# Patient Record
Sex: Female | Born: 1990 | Marital: Single | Smoking: Never smoker
Health system: Southern US, Community
[De-identification: ages and names within clinical notes are randomized; demographics above are authoritative.]

## PROBLEM LIST (undated history)

## (undated) DIAGNOSIS — Z789 Other specified health status: Secondary | ICD-10-CM

## (undated) HISTORY — PX: NO PAST SURGERIES: SHX2092

---

## 2017-02-01 ENCOUNTER — Encounter: Payer: Self-pay | Admitting: Certified Nurse Midwife

## 2017-02-06 ENCOUNTER — Other Ambulatory Visit (HOSPITAL_COMMUNITY)
Admission: RE | Admit: 2017-02-06 | Discharge: 2017-02-06 | Disposition: A | Discharge status: Home / Self Care | Payer: Medicaid Other | Source: Ambulatory Visit | Attending: Certified Nurse Midwife | Admitting: Certified Nurse Midwife

## 2017-02-06 ENCOUNTER — Ambulatory Visit (INDEPENDENT_AMBULATORY_CARE_PROVIDER_SITE_OTHER): Payer: Medicaid Other | Admitting: Obstetrics

## 2017-02-06 ENCOUNTER — Encounter: Payer: Self-pay | Admitting: Obstetrics

## 2017-02-06 VITALS — BP 122/73 | HR 76 | Ht 62.0 in | Wt 136.8 lb

## 2017-02-06 DIAGNOSIS — Z3A12 12 weeks gestation of pregnancy: Secondary | ICD-10-CM | POA: Insufficient documentation

## 2017-02-06 DIAGNOSIS — N760 Acute vaginitis: Secondary | ICD-10-CM | POA: Insufficient documentation

## 2017-02-06 DIAGNOSIS — O23591 Infection of other part of genital tract in pregnancy, first trimester: Secondary | ICD-10-CM | POA: Diagnosis not present

## 2017-02-06 DIAGNOSIS — Z3402 Encounter for supervision of normal first pregnancy, second trimester: Secondary | ICD-10-CM | POA: Diagnosis not present

## 2017-02-06 DIAGNOSIS — Z3400 Encounter for supervision of normal first pregnancy, unspecified trimester: Secondary | ICD-10-CM | POA: Insufficient documentation

## 2017-02-06 DIAGNOSIS — B9689 Other specified bacterial agents as the cause of diseases classified elsewhere: Secondary | ICD-10-CM | POA: Insufficient documentation

## 2017-02-06 DIAGNOSIS — Z3401 Encounter for supervision of normal first pregnancy, first trimester: Secondary | ICD-10-CM | POA: Diagnosis present

## 2017-02-06 MED ORDER — PRENATE MINI 29-0.6-0.4-350 MG PO CAPS: 1.0000 | ORAL_CAPSULE | Freq: Every day | ORAL | 4 refills | Status: DC

## 2017-02-06 NOTE — Progress Notes (Signed)
Patient is in the office for initial ob visit, denies pain. 

## 2017-02-06 NOTE — Addendum Note (Signed)
Addended by: Natale MilchSTALLING, Jylian Pappalardo D on: 02/06/2017 09:28 AM   Modules accepted: Orders

## 2017-02-06 NOTE — Progress Notes (Signed)
Subjective:    Shannon Reynolds is being seen today for her first obstetrical visit.  This is not a planned pregnancy. She is at [redacted]w[redacted]d gestation. Her obstetrical history is significant for none. Relationship with FOB: significant other, not living together. Patient does intend to breast feed. Pregnancy history fully reviewed.  The information documented in the HPI was reviewed and verified.  Menstrual History: OB History    Gravida Para Term Preterm AB Living   1             SAB TAB Ectopic Multiple Live Births                   Patient's last menstrual period was 11/14/2016.    History reviewed. No pertinent past medical history.  History reviewed. No pertinent surgical history.   (Not in a hospital admission) Allergies  Allergen Reactions  . Apple Juice [Apple] Hives    Social History  Substance Use Topics  . Smoking status: Never Smoker  . Smokeless tobacco: Never Used  . Alcohol use No    Family History  Problem Relation Age of Onset  . Hypertension Mother   . Asthma Maternal Grandmother   . Hypertension Maternal Grandmother   . Diabetes Maternal Grandmother      Review of Systems Constitutional: negative for weight loss Gastrointestinal: negative for vomiting Genitourinary:negative for genital lesions and vaginal discharge and dysuria Musculoskeletal:negative for back pain Behavioral/Psych: negative for abusive relationship, depression, illegal drug usage and tobacco use    Objective:    BP 122/73   Pulse 76   Ht 5\' 2"  (1.575 m)   Wt 136 lb 12.8 oz (62.1 kg)   LMP 11/14/2016   BMI 25.02 kg/m  General Appearance:    Alert, cooperative, no distress, appears stated age  Head:    Normocephalic, without obvious abnormality, atraumatic  Eyes:    PERRL, conjunctiva/corneas clear, EOM's intact, fundi    benign, both eyes  Ears:    Normal TM's and external ear canals, both ears  Nose:   Nares normal, septum midline, mucosa normal, no drainage    or sinus  tenderness  Throat:   Lips, mucosa, and tongue normal; teeth and gums normal  Neck:   Supple, symmetrical, trachea midline, no adenopathy;    thyroid:  no enlargement/tenderness/nodules; no carotid   bruit or JVD  Back:     Symmetric, no curvature, ROM normal, no CVA tenderness  Lungs:     Clear to auscultation bilaterally, respirations unlabored  Chest Wall:    No tenderness or deformity   Heart:    Regular rate and rhythm, S1 and S2 normal, no murmur, rub   or gallop  Breast Exam:    No tenderness, masses, or nipple abnormality  Abdomen:     Soft, non-tender, bowel sounds active all four quadrants,    no masses, no organomegaly  Genitalia:    Normal female without lesion, discharge or tenderness  Extremities:   Extremities normal, atraumatic, no cyanosis or edema  Pulses:   2+ and symmetric all extremities  Skin:   Skin color, texture, turgor normal, no rashes or lesions  Lymph nodes:   Cervical, supraclavicular, and axillary nodes normal  Neurologic:   CNII-XII intact, normal strength, sensation and reflexes    throughout      Lab Review Urine pregnancy test Labs reviewed yes Radiologic studies reviewed yes  Assessment:    Pregnancy at [redacted]w[redacted]d weeks    Plan:  1. Supervision of normal first pregnancy, antepartum Rx: - Cytology - PAP - Cervicovaginal ancillary only - Hemoglobinopathy evaluation - Vitamin D (25 hydroxy) - Obstetric Panel, Including HIV - Culture, OB Urine - Varicella zoster antibody, IgG - Prenat w/o A-FeCbn-Meth-FA-DHA (PRENATE MINI) 29-0.6-0.4-350 MG CAPS; Take 1 capsule by mouth daily before breakfast.  Dispense: 90 capsule; Refill:    Prenatal vitamins.  Counseling provided regarding continued use of seat belts, cessation of alcohol consumption, smoking or use of illicit drugs; infection precautions i.e., influenza/TDAP immunizations, toxoplasmosis,CMV, parvovirus, listeria and varicella; workplace safety, exercise during pregnancy; routine dental  care, safe medications, sexual activity, hot tubs, saunas, pools, travel, caffeine use, fish and methlymercury, potential toxins, hair treatments, varicose veins Weight gain recommendations per IOM guidelines reviewed: underweight/BMI< 18.5--> gain 28 - 40 lbs; normal weight/BMI 18.5 - 24.9--> gain 25 - 35 lbs; overweight/BMI 25 - 29.9--> gain 15 - 25 lbs; obese/BMI >30->gain  11 - 20 lbs Problem list reviewed and updated. FIRST/CF mutation testing/NIPT/QUAD SCREEN/fragile X/Ashkenazi Jewish population testing/Spinal muscular atrophy discussed: requested. Role of ultrasound in pregnancy discussed; fetal survey: requested. Amniocentesis discussed: not indicated VBAC calculator score: VBAC consent form provided No orders of the defined types were placed in this encounter.  No orders of the defined types were placed in this encounter.   Follow up in 4 weeks. 50% of 20 min visit spent on counseling and coordination of care. Patient ID: Shannon Reynolds, female   DOB: 07/14/1991, 26 y.o.   MRN: [id number] Patient ID: Shannon Reynolds, female   DOB: 03/22/1991, 26 y.o.   MRN: [id number]

## 2017-02-06 NOTE — Addendum Note (Signed)
Addended by: Natale MilchSTALLING, BRITTANY D on: 02/06/2017 09:29 AM   Modules accepted: Orders

## 2017-02-07 LAB — CYTOLOGY - PAP
BACTERIAL VAGINITIS: POSITIVE — AB
CANDIDA VAGINITIS: NEGATIVE
Chlamydia: NEGATIVE
Neisseria Gonorrhea: NEGATIVE
TRICH (WINDOWPATH): NEGATIVE

## 2017-02-08 ENCOUNTER — Other Ambulatory Visit: Payer: Self-pay | Admitting: Obstetrics

## 2017-02-08 DIAGNOSIS — B9689 Other specified bacterial agents as the cause of diseases classified elsewhere: Secondary | ICD-10-CM

## 2017-02-08 DIAGNOSIS — N760 Acute vaginitis: Principal | ICD-10-CM

## 2017-02-08 MED ORDER — METRONIDAZOLE 500 MG PO TABS: 500.0000 mg | ORAL_TABLET | Freq: Two times a day (BID) | ORAL | 2 refills | Status: DC

## 2017-02-09 ENCOUNTER — Other Ambulatory Visit: Payer: Self-pay | Admitting: Obstetrics

## 2017-02-09 DIAGNOSIS — E559 Vitamin D deficiency, unspecified: Secondary | ICD-10-CM

## 2017-02-09 LAB — OBSTETRIC PANEL, INCLUDING HIV
ANTIBODY SCREEN: NEGATIVE
BASOS: 0 %
Basophils Absolute: 0 10*3/uL (ref 0.0–0.2)
EOS (ABSOLUTE): 0.1 10*3/uL (ref 0.0–0.4)
EOS: 1 %
HEMATOCRIT: 33.9 % — AB (ref 34.0–46.6)
HEMOGLOBIN: 11 g/dL — AB (ref 11.1–15.9)
HIV Screen 4th Generation wRfx: NONREACTIVE
Hepatitis B Surface Ag: NEGATIVE
IMMATURE GRANS (ABS): 0 10*3/uL (ref 0.0–0.1)
Immature Granulocytes: 0 %
LYMPHS: 25 %
Lymphocytes Absolute: 2.3 10*3/uL (ref 0.7–3.1)
MCH: 26.1 pg — AB (ref 26.6–33.0)
MCHC: 32.4 g/dL (ref 31.5–35.7)
MCV: 81 fL (ref 79–97)
MONOS ABS: 0.7 10*3/uL (ref 0.1–0.9)
Monocytes: 8 %
Neutrophils Absolute: 6.2 10*3/uL (ref 1.4–7.0)
Neutrophils: 66 %
Platelets: 220 10*3/uL (ref 150–379)
RBC: 4.21 x10E6/uL (ref 3.77–5.28)
RDW: 13.6 % (ref 12.3–15.4)
RH TYPE: POSITIVE
RPR Ser Ql: NONREACTIVE
Rubella Antibodies, IGG: 4.05 index (ref >0.99)
WBC: 9.3 10*3/uL (ref 3.4–10.8)

## 2017-02-09 LAB — VITAMIN D 25 HYDROXY (VIT D DEFICIENCY, FRACTURES): Vit D, 25-Hydroxy: 7.1 ng/mL — ABNORMAL LOW (ref 30.0–100.0)

## 2017-02-09 LAB — HEMOGLOBINOPATHY EVALUATION
HEMOGLOBIN F QUANTITATION: 0 % (ref 0.0–2.0)
HGB A: 97.7 % (ref 96.4–98.8)
HGB C: 0 %
HGB S: 0 %
HGB VARIANT: 0 %
Hemoglobin A2 Quantitation: 2.3 % (ref 1.8–3.2)

## 2017-02-09 LAB — VARICELLA ZOSTER ANTIBODY, IGG: Varicella zoster IgG: 1010 index (ref >165)

## 2017-02-09 MED ORDER — VITAMIN D 50 MCG (2000 UT) PO CAPS: 2000.0000 [IU] | ORAL_CAPSULE | Freq: Every day | ORAL | 11 refills | Status: AC

## 2017-02-12 ENCOUNTER — Other Ambulatory Visit: Payer: Self-pay | Admitting: Obstetrics

## 2017-02-12 DIAGNOSIS — B955 Unspecified streptococcus as the cause of diseases classified elsewhere: Secondary | ICD-10-CM

## 2017-02-12 DIAGNOSIS — O2340 Unspecified infection of urinary tract in pregnancy, unspecified trimester: Principal | ICD-10-CM

## 2017-02-12 LAB — URINE CULTURE, OB REFLEX

## 2017-02-12 LAB — CULTURE, OB URINE

## 2017-02-12 MED ORDER — AMOXICILLIN-POT CLAVULANATE 875-125 MG PO TABS: 1.0000 | ORAL_TABLET | Freq: Two times a day (BID) | ORAL | 0 refills | Status: DC

## 2017-02-14 LAB — CYSTIC FIBROSIS MUTATION 97: Interpretation: NOT DETECTED

## 2017-03-06 ENCOUNTER — Encounter: Payer: Self-pay | Admitting: Obstetrics

## 2017-03-06 ENCOUNTER — Ambulatory Visit (INDEPENDENT_AMBULATORY_CARE_PROVIDER_SITE_OTHER): Payer: Medicaid Other | Admitting: Obstetrics

## 2017-03-06 VITALS — BP 121/67 | HR 86 | Wt 139.1 lb

## 2017-03-06 DIAGNOSIS — Z3402 Encounter for supervision of normal first pregnancy, second trimester: Secondary | ICD-10-CM

## 2017-03-06 DIAGNOSIS — Z3400 Encounter for supervision of normal first pregnancy, unspecified trimester: Secondary | ICD-10-CM

## 2017-03-06 NOTE — Progress Notes (Signed)
Subjective:  Shannon Reynolds is a 26 y.o. G1P0 at 2654w3d being seen today for ongoing prenatal care.  She is currently monitored for the following issues for this low-risk pregnancy and has Supervision of normal first pregnancy, antepartum and Beta hemolytic strep UTI complicating pregnancy on her problem list.  Patient reports no complaints.  Contractions: Not present. Vag. Bleeding: None.   . Denies leaking of fluid.   The following portions of the patient's history were reviewed and updated as appropriate: allergies, current medications, past family history, past medical history, past social history, past surgical history and problem list. Problem list updated.  Objective:   Vitals:   03/06/17 0854  BP: 121/67  Pulse: 86  Weight: 139 lb 1.6 oz (63.1 kg)    Fetal Status: Fetal Heart Rate (bpm): 150         General:  Alert, oriented and cooperative. Patient is in no acute distress.  Skin: Skin is warm and dry. No rash noted.   Cardiovascular: Normal heart rate noted  Respiratory: Normal respiratory effort, no problems with respiration noted  Abdomen: Soft, gravid, appropriate for gestational age. Pain/Pressure: Absent     Pelvic:  Cervical exam deferred        Extremities: Normal range of motion.  Edema: None  Mental Status: Normal mood and affect. Normal behavior. Normal judgment and thought content.   Urinalysis:      Assessment and Plan:  Pregnancy: G1P0 at 3854w3d  1. Supervision of normal first pregnancy, antepartum Rx: - AFP TETRA - US MFM OB COMP + 14 WK; Future  Preterm labor symptoms and general obstetric precautions including but not limited to vaginal bleeding, contractions, leaking of fluid and fetal movement were reviewed in detail with the patient. Please refer to After Visit Summary for other counseling recommendations.  Return in about 4 weeks (around 04/03/2017) for ROB, Babyscripts.   Brock BadHarper, Charles A, MD

## 2017-03-06 NOTE — Progress Notes (Signed)
Pt here for a ROB and Quad screen. Pt denies any complaints.

## 2017-03-09 LAB — AFP TETRA
DIA MOM VALUE: 1.42
DIA Value (EIA): 253.97 pg/mL
DSR (BY AGE) 1 IN: 987
DSR (Second Trimester) 1 IN: 10000
Gestational Age: 16 WEEKS
MATERNAL AGE AT EDD: 25.9 a
MSAFP MOM: 2.8
MSAFP: 96.7 ng/mL
MSHCG Mom: 1.58
MSHCG: 65364 m[IU]/mL
OSB RISK: 166
T18 (By Age): 1:3847 {titer}
TEST RESULTS AFP: POSITIVE — AB
Weight: 139 [lb_av]
uE3 Mom: 1.32
uE3 Value: 1.04 ng/mL

## 2017-03-11 ENCOUNTER — Other Ambulatory Visit: Payer: Self-pay | Admitting: Obstetrics

## 2017-03-11 DIAGNOSIS — R772 Abnormality of alphafetoprotein: Secondary | ICD-10-CM

## 2017-03-13 ENCOUNTER — Other Ambulatory Visit: Payer: Self-pay | Admitting: Obstetrics

## 2017-03-20 ENCOUNTER — Other Ambulatory Visit: Payer: Self-pay | Admitting: Obstetrics

## 2017-03-20 DIAGNOSIS — O280 Abnormal hematological finding on antenatal screening of mother: Secondary | ICD-10-CM

## 2017-03-21 ENCOUNTER — Telehealth: Payer: Self-pay

## 2017-03-21 NOTE — Telephone Encounter (Signed)
error 

## 2017-03-21 NOTE — Telephone Encounter (Signed)
-----   Message from Brock Bad, MD sent at 03/13/2017  2:04 PM EDT ----- Positive AFP-tetra.  Referred to MFM.

## 2017-03-21 NOTE — Telephone Encounter (Signed)
Left mess on VM to call office 

## 2017-03-22 ENCOUNTER — Other Ambulatory Visit (HOSPITAL_COMMUNITY): Payer: Self-pay | Admitting: *Deleted

## 2017-03-22 ENCOUNTER — Ambulatory Visit (HOSPITAL_COMMUNITY)
Admission: RE | Admit: 2017-03-22 | Discharge: 2017-03-22 | Disposition: A | Discharge status: Home / Self Care | Payer: Medicaid Other | Source: Ambulatory Visit | Attending: Obstetrics | Admitting: Obstetrics

## 2017-03-22 ENCOUNTER — Encounter (HOSPITAL_COMMUNITY): Payer: Self-pay

## 2017-03-22 DIAGNOSIS — Z363 Encounter for antenatal screening for malformations: Secondary | ICD-10-CM | POA: Insufficient documentation

## 2017-03-22 DIAGNOSIS — R772 Abnormality of alphafetoprotein: Secondary | ICD-10-CM

## 2017-03-22 DIAGNOSIS — Z3A18 18 weeks gestation of pregnancy: Secondary | ICD-10-CM | POA: Diagnosis not present

## 2017-03-22 DIAGNOSIS — O280 Abnormal hematological finding on antenatal screening of mother: Secondary | ICD-10-CM

## 2017-03-22 DIAGNOSIS — O288 Other abnormal findings on antenatal screening of mother: Secondary | ICD-10-CM | POA: Insufficient documentation

## 2017-03-22 HISTORY — DX: Other specified health status: Z78.9

## 2017-03-22 NOTE — Progress Notes (Signed)
Genetic Counseling  High-Risk Gestation Note  Appointment Date:  03/22/2017 Referred By: Brock BadHarper, Charles A, MD Date of Birth:  10/28/1990 Partner:  Wendy Poetarrin Fowler   Pregnancy History: G1P0 Estimated Date of Delivery: 08/18/17 Estimated Gestational Age: 2574w5d Attending: Rema FendtJoshua Nitsche, MD   Ms. Orie FishermanKyshma Haynes and her partner, Mr. Wendy PoetDarrin Fowler, were seen for consultation for genetic counseling because of an elevated MSAFP of 2.80 MoMs based on maternal serum screening through [facility].    In summary:  Discussed elevated MSAFP   Reviewed possible explanations for elevation  Discussed additional options  Ultrasound- performed today; see separate report  Follow-up ultrasound scheduled 04/19/17  Amniocentesis- declined  Discussed associations with unexplained elevated MSAFP  Reviewed family history concerns   Discussed carrier screening options  CF  SMA  Hemoglobinopathies  We reviewed Ms. Narasimhan's maternal serum screening result, the elevation of MSAFP, and the associated 1 in 166 risk for a fetal open neural tube defect.   We reviewed open neural tube defects including: the typical multifactorial etiology and variable prognosis.  In addition, we discussed alternative explanations for an elevated MSAFP including: normal variation, twins, feto-maternal bleeding, a gestational dating error, abdominal wall defects, kidney differences, oligohydramnios, and placental problems.  We discussed that an unexplained elevation of MSAFP is associated with an increased risk for third trimester complications including: prematurity, low birth weight, and pre-eclampsia.    We reviewed additional available screening and diagnostic options including detailed ultrasound and amniocentesis.  We discussed the risks, limitations, and benefits of each.  After thoughtful consideration of these options, Ms. Orie FishermanKyshma Elenes elected to have ultrasound, but declined amniocentesis.  She understands that ultrasound  cannot rule out all birth defects or genetic syndromes.  However, she was counseled that 90% of fetuses with open neural tube defects can be detected by detailed second trimester ultrasound, when well visualized.  A complete ultrasound was performed today. Visualized fetal anatomy was within normal limits.  The ultrasound report will be sent under separate cover. Follow-up ultrasound is scheduled for 04/19/17 to assess fetal growth.   Ms. Orie FishermanKyshma Recht was provided with written information regarding cystic fibrosis (CF), spinal muscular atrophy (SMA) and hemoglobinopathies including the carrier frequency, availability of carrier screening and prenatal diagnosis if indicated.  In addition, we discussed that CF and hemoglobinopathies are routinely screened for as part of the [place] newborn screening panel.  After further discussion, she declined screening for CF, SMA and hemoglobinopathies.  Both family histories were reviewed and found to be noncontributory for birth defects, intellectual disability, and known genetic conditions.  Consanguinity was denied. Without further information regarding the provided family history, an accurate genetic risk cannot be calculated. Further genetic counseling is warranted if more information is obtained.  Ms. Orie FishermanKyshma Graveline denied exposure to environmental toxins or chemical agents. She denied the use of alcohol, tobacco or street drugs. She denied significant viral illnesses during the course of her pregnancy. Her medical and surgical histories were noncontributory.   I counseled this couple for approximately 30 minutes regarding the above risks and available options.    Quinn PlowmanKaren Tsuyako Jolley, MS,  Certified Genetic Counselor 03/22/2017

## 2017-04-03 ENCOUNTER — Encounter: Payer: Self-pay | Admitting: Obstetrics

## 2017-04-03 ENCOUNTER — Ambulatory Visit (INDEPENDENT_AMBULATORY_CARE_PROVIDER_SITE_OTHER): Payer: Medicaid Other | Admitting: Obstetrics

## 2017-04-03 VITALS — BP 107/61 | HR 78 | Wt 140.3 lb

## 2017-04-03 DIAGNOSIS — Z3400 Encounter for supervision of normal first pregnancy, unspecified trimester: Secondary | ICD-10-CM

## 2017-04-03 DIAGNOSIS — Z3402 Encounter for supervision of normal first pregnancy, second trimester: Secondary | ICD-10-CM

## 2017-04-03 DIAGNOSIS — O9989 Other specified diseases and conditions complicating pregnancy, childbirth and the puerperium: Secondary | ICD-10-CM

## 2017-04-03 DIAGNOSIS — M549 Dorsalgia, unspecified: Secondary | ICD-10-CM

## 2017-04-03 NOTE — Progress Notes (Signed)
Subjective:  Shannon Reynolds is a 26 y.o. G1P0 at 1074w3d being seen today for ongoing prenatal care.  She is currently monitored for the following issues for this low-risk pregnancy and has Supervision of normal first pregnancy, antepartum; Beta hemolytic strep UTI complicating pregnancy; Abnormal MSAFP (maternal serum alpha-fetoprotein), elevated; and [redacted] weeks gestation of pregnancy on her problem list.  Patient reports backache.  Contractions: Not present. Vag. Bleeding: None.  Movement: Present. Denies leaking of fluid.   The following portions of the patient's history were reviewed and updated as appropriate: allergies, current medications, past family history, past medical history, past social history, past surgical history and problem list. Problem list updated.  Objective:   Vitals:   04/03/17 0926  BP: 107/61  Pulse: 78  Weight: 140 lb 4.8 oz (63.6 kg)    Fetal Status: Fetal Heart Rate (bpm): 150   Movement: Present     General:  Alert, oriented and cooperative. Patient is in no acute distress.  Skin: Skin is warm and dry. No rash noted.   Cardiovascular: Normal heart rate noted  Respiratory: Normal respiratory effort, no problems with respiration noted  Abdomen: Soft, gravid, appropriate for gestational age. Pain/Pressure: Absent     Pelvic:  Cervical exam deferred        Extremities: Normal range of motion.  Edema: None  Mental Status: Normal mood and affect. Normal behavior. Normal judgment and thought content.   Urinalysis:      Assessment and Plan:  Pregnancy: G1P0 at 6674w3d  1. Supervision of normal first pregnancy, antepartum   2. Backache symptom - Maternity Belt Rx  Preterm labor symptoms and general obstetric precautions including but not limited to vaginal bleeding, contractions, leaking of fluid and fetal movement were reviewed in detail with the patient. Please refer to After Visit Summary for other counseling recommendations.  Return in about 4 weeks (around  05/01/2017) for ROB.   Brock BadHarper, Amorita Vanrossum A, MD

## 2017-04-03 NOTE — Progress Notes (Signed)
Patient is in the office, reports good fetal movement, denies pain.

## 2017-04-03 NOTE — Patient Instructions (Addendum)
Breast Pumping Tips If you are breastfeeding, there may be times when you cannot feed your baby directly. Returning to work or going on a trip are examples. Pumping allows you to store breast milk and feed it to your baby later. You may not get much milk when you first start to pump. Your breasts should start to make more after a few days. If you pump at the times you usually feed your baby, you may be able to keep making enough milk to feed your baby without also using formula. The more often you pump, the more milk your body will make. When should I pump?  You can start to pump soon after you have your baby. Ask your doctor what is right for you and your baby.  If you are going back to work, start pumping a few weeks before. This gives you time to learn how to pump and to store a supply of milk.  When you are with your baby, feed your baby when he or she is hungry. Pump after each feeding.  When you are away from your baby for many hours, pump for about 15 minutes every 2-3 hours. Pump both breasts at the same time if you can.  If your baby has a formula feeding, make sure to pump close to the same time.  If you drink any alcohol, wait 2 hours before pumping. How do I get ready to pump? Your let-down reflex is your body's natural reaction that makes your breast milk flow. It is easier to make your breast milk flow when you are relaxed. Try these things to help you relax:  Smell one of your baby's blankets or an item of clothing.  Look at a picture or video of your baby.  Sit in a quiet, private space.  Massage the breast you plan to pump.  Place soothing warmth on the breast.  Play relaxing music.  What are some breast pumping tips?  Wash your hands before you pump. You do not need to wash your nipples or breasts.  There are three ways to pump. You can: ? Use your hand to massage and squeeze your breast. ? Use a handheld manual pump. ? Use an electric pump.  Make sure the  suction cup on the breast pump is the right size. Place the suction cup directly over the nipple. It can be painful or hurt your nipple if it is the wrong size or placed wrong.  Put a small amount of purified or modified lanolin on your nipple and areola if you are sore.  If you are using an electric pump, change the speed and suction power to be more comfortable.  You may need a different type of pump if pumping hurts or you do not get a lot of milk. Your doctor can help you pick what type of pump to use.  Keep a full water bottle near you always. Drinking lots of fluid helps you make more milk.  You can store your milk to use later. Pumped breast milk can be stored in a sealable, sterile container or plastic bag. Always put the date you pumped it on the container. ? Milk can stay out at room temperature for up to 8 hours. ? You can store your milk in the refrigerator for up to 8 days. ? You can store your milk in the freezer for 3 months. Thaw frozen milk using warm water. Do not put it in the microwave.  Do not smoke. Ask  your doctor for help. When should I call my doctor?  You have a hard time pumping.  You are worried you do not make enough milk.  You have nipple pain, soreness, or redness.  You want to take birth control pills. This information is not intended to replace advice given to you by your health care provider. Make sure you discuss any questions you have with your health care provider. Document Released: 12/27/2007 Document Revised: 12/16/2015 Document Reviewed: 05/02/2013 Elsevier Interactive Patient Education  2017 [employer]  Back Pain in Pregnancy Back pain during pregnancy is common. Back pain may be caused by several factors that are related to changes during your pregnancy. Follow these instructions at home: Managing pain, stiffness, and swelling  If directed, apply ice for sudden (acute) back pain. ? Put ice in a plastic bag. ? Place a towel between  your skin and the bag. ? Leave the ice on for 20 minutes, 2-3 times per day.  If directed, apply heat to the affected area before you exercise: ? Place a towel between your skin and the heat pack or heating pad. ? Leave the heat on for 20-30 minutes. ? Remove the heat if your skin turns bright red. This is especially important if you are unable to feel pain, heat, or cold. You may have a greater risk of getting burned. Activity  Exercise as told by your health care provider. Exercising is the best way to prevent or manage back pain.  Listen to your body when lifting. If lifting hurts, ask for help or bend your knees. This uses your leg muscles instead of your back muscles.  Squat down when picking up something from the floor. Do not bend over.  Only use bed rest as told by your health care provider. Bed rest should only be used for the most severe episodes of back pain. Standing, Sitting, and Lying Down  Do not stand in one place for long periods of time.  Use good posture when sitting. Make sure your head rests over your shoulders and is not hanging forward. Use a pillow on your lower back if necessary.  Try sleeping on your side, preferably the left side, with a pillow or two between your legs. If you are sore after a night's rest, your bed may be too soft. A firm mattress may provide more support for your back during pregnancy. General instructions  Do not wear high heels.  Eat a healthy diet. Try to gain weight within your health care provider's recommendations.  Use a maternity girdle, elastic sling, or back brace as told by your health care provider.  Take over-the-counter and prescription medicines only as told by your health care provider.  Keep all follow-up visits as told by your health care provider. This is important. This includes any visits with any specialists, such as a physical therapist. Contact a health care provider if:  Your back pain interferes with your  daily activities.  You have increasing pain in other parts of your body. Get help right away if:  You develop numbness, tingling, weakness, or problems with the use of your arms or legs.  You develop severe back pain that is not controlled with medicine.  You have a sudden change in bowel or bladder control.  You develop shortness of breath, dizziness, or you faint.  You develop nausea, vomiting, or sweating.  You have back pain that is a rhythmic, cramping pain similar to labor pains. Labor pain is usually 1-2 minutes  apart, lasts for about 1 minute, and involves a bearing down feeling or pressure in your pelvis.  You have back pain and your water breaks or you have vaginal bleeding.  You have back pain or numbness that travels down your leg.  Your back pain developed after you fell.  You develop pain on one side of your back.  You see blood in your urine.  You develop skin blisters in the area of your back pain. This information is not intended to replace advice given to you by your health care provider. Make sure you discuss any questions you have with your health care provider. Document Released: 10/18/2005 Document Revised: 12/16/2015 Document Reviewed: 03/24/2015 Elsevier Interactive Patient Education  [employer].

## 2017-04-17 ENCOUNTER — Encounter: Payer: Self-pay | Admitting: *Deleted

## 2017-04-19 ENCOUNTER — Ambulatory Visit (HOSPITAL_COMMUNITY)
Admission: RE | Admit: 2017-04-19 | Discharge: 2017-04-19 | Disposition: A | Discharge status: Home / Self Care | Payer: Medicaid Other | Source: Ambulatory Visit | Attending: Obstetrics | Admitting: Obstetrics

## 2017-04-19 ENCOUNTER — Other Ambulatory Visit (HOSPITAL_COMMUNITY): Payer: Self-pay | Admitting: *Deleted

## 2017-04-19 ENCOUNTER — Encounter (HOSPITAL_COMMUNITY): Payer: Self-pay

## 2017-04-19 DIAGNOSIS — O280 Abnormal hematological finding on antenatal screening of mother: Secondary | ICD-10-CM

## 2017-04-19 DIAGNOSIS — Z362 Encounter for other antenatal screening follow-up: Secondary | ICD-10-CM | POA: Insufficient documentation

## 2017-04-19 DIAGNOSIS — Z3A23 23 weeks gestation of pregnancy: Secondary | ICD-10-CM | POA: Diagnosis not present

## 2017-04-19 DIAGNOSIS — O281 Abnormal biochemical finding on antenatal screening of mother: Secondary | ICD-10-CM | POA: Diagnosis not present

## 2017-04-19 DIAGNOSIS — R772 Abnormality of alphafetoprotein: Secondary | ICD-10-CM

## 2017-04-19 DIAGNOSIS — O289 Unspecified abnormal findings on antenatal screening of mother: Secondary | ICD-10-CM

## 2017-05-01 ENCOUNTER — Encounter: Payer: Medicaid Other | Admitting: Obstetrics

## 2017-05-07 ENCOUNTER — Encounter: Payer: Self-pay | Admitting: Obstetrics

## 2017-05-07 ENCOUNTER — Ambulatory Visit (INDEPENDENT_AMBULATORY_CARE_PROVIDER_SITE_OTHER): Payer: Medicaid Other | Admitting: Obstetrics

## 2017-05-07 VITALS — BP 107/54 | HR 75 | Wt 146.9 lb

## 2017-05-07 DIAGNOSIS — Z3402 Encounter for supervision of normal first pregnancy, second trimester: Secondary | ICD-10-CM

## 2017-05-07 DIAGNOSIS — Z3400 Encounter for supervision of normal first pregnancy, unspecified trimester: Secondary | ICD-10-CM

## 2017-05-07 NOTE — Progress Notes (Signed)
Subjective:  Shannon Reynolds is a 26 y.o. G1P0 at [redacted]w[redacted]d being seen today for ongoing prenatal care.  She is currently monitored for the following issues for this low-risk pregnancy and has Supervision of normal first pregnancy, antepartum; Beta hemolytic strep UTI complicating pregnancy; Abnormal MSAFP (maternal serum alpha-fetoprotein), elevated; and [redacted] weeks gestation of pregnancy on her problem list.  Patient reports no complaints.  Contractions: Not present. Vag. Bleeding: None.  Movement: Present. Denies leaking of fluid.   The following portions of the patient's history were reviewed and updated as appropriate: allergies, current medications, past family history, past medical history, past social history, past surgical history and problem list. Problem list updated.  Objective:   Vitals:   05/07/17 1103  BP: (!) 107/54  Pulse: 75  Weight: 146 lb 14.4 oz (66.6 kg)    Fetal Status:     Movement: Present     General:  Alert, oriented and cooperative. Patient is in no acute distress.  Skin: Skin is warm and dry. No rash noted.   Cardiovascular: Normal heart rate noted  Respiratory: Normal respiratory effort, no problems with respiration noted  Abdomen: Soft, gravid, appropriate for gestational age. Pain/Pressure: Absent     Pelvic:  Cervical exam deferred        Extremities: Normal range of motion.  Edema: None  Mental Status: Normal mood and affect. Normal behavior. Normal judgment and thought content.   Urinalysis:      Assessment and Plan:  Pregnancy: G1P0 at [redacted]w[redacted]d  1. Supervision of normal first pregnancy, antepartum   Preterm labor symptoms and general obstetric precautions including but not limited to vaginal bleeding, contractions, leaking of fluid and fetal movement were reviewed in detail with the patient. Please refer to After Visit Summary for other counseling recommendations.  Return in about 3 weeks (around 05/28/2017) for ROB, 2 hour OGTT.   Brock Bad,  MD

## 2017-05-28 ENCOUNTER — Encounter: Payer: Self-pay | Admitting: Obstetrics

## 2017-05-28 ENCOUNTER — Other Ambulatory Visit: Payer: Medicaid Other

## 2017-05-28 ENCOUNTER — Ambulatory Visit (INDEPENDENT_AMBULATORY_CARE_PROVIDER_SITE_OTHER): Payer: Medicaid Other | Admitting: Obstetrics

## 2017-05-28 VITALS — BP 98/57 | HR 74 | Wt 144.7 lb

## 2017-05-28 DIAGNOSIS — Z23 Encounter for immunization: Secondary | ICD-10-CM

## 2017-05-28 DIAGNOSIS — Z3403 Encounter for supervision of normal first pregnancy, third trimester: Secondary | ICD-10-CM

## 2017-05-28 DIAGNOSIS — Z3400 Encounter for supervision of normal first pregnancy, unspecified trimester: Secondary | ICD-10-CM

## 2017-05-28 NOTE — Progress Notes (Signed)
ROB/GTT. 

## 2017-05-28 NOTE — Progress Notes (Signed)
Subjective:  Shannon Reynolds is a 26 y.o. G1P0 at 5193w2d being seen today for ongoing prenatal care.  She is currently monitored for the following issues for this low-risk pregnancy and has Supervision of normal first pregnancy, antepartum; Beta hemolytic strep UTI complicating pregnancy; Abnormal MSAFP (maternal serum alpha-fetoprotein), elevated; and [redacted] weeks gestation of pregnancy on their problem list.  Patient reports no complaints.  Contractions: Not present. Vag. Bleeding: None.  Movement: Present. Denies leaking of fluid.   The following portions of the patient's history were reviewed and updated as appropriate: allergies, current medications, past family history, past medical history, past social history, past surgical history and problem list. Problem list updated.  Objective:   Vitals:   05/28/17 1007  BP: (!) 98/57  Pulse: 74  Weight: 144 lb 11.2 oz (65.6 kg)    Fetal Status: Fetal Heart Rate (bpm): 150   Movement: Present     General:  Alert, oriented and cooperative. Patient is in no acute distress.  Skin: Skin is warm and dry. No rash noted.   Cardiovascular: Normal heart rate noted  Respiratory: Normal respiratory effort, no problems with respiration noted  Abdomen: Soft, gravid, appropriate for gestational age. Pain/Pressure: Absent     Pelvic:  Cervical exam deferred        Extremities: Normal range of motion.  Edema: None  Mental Status: Normal mood and affect. Normal behavior. Normal judgment and thought content.   Urinalysis:      Assessment and Plan:  Pregnancy: G1P0 at 8493w2d  1. Supervision of normal first pregnancy, antepartum   Preterm labor symptoms and general obstetric precautions including but not limited to vaginal bleeding, contractions, leaking of fluid and fetal movement were reviewed in detail with the patient. Please refer to After Visit Summary for other counseling recommendations.  Return in about 2 weeks (around 06/11/2017) for  ROB.   Brock BadHarper, French Kendra A, MD

## 2017-05-29 LAB — CBC
Hematocrit: 33.5 % — ABNORMAL LOW (ref 34.0–46.6)
Hemoglobin: 11.1 g/dL (ref 11.1–15.9)
MCH: 26.8 pg (ref 26.6–33.0)
MCHC: 33.1 g/dL (ref 31.5–35.7)
MCV: 81 fL (ref 79–97)
PLATELETS: 168 10*3/uL (ref 150–379)
RBC: 4.14 x10E6/uL (ref 3.77–5.28)
RDW: 14.6 % (ref 12.3–15.4)
WBC: 10.7 10*3/uL (ref 3.4–10.8)

## 2017-05-29 LAB — GLUCOSE TOLERANCE, 2 HOURS W/ 1HR
GLUCOSE, 1 HOUR: 124 mg/dL (ref 65–179)
GLUCOSE, 2 HOUR: 83 mg/dL (ref 65–152)
GLUCOSE, FASTING: 76 mg/dL (ref 65–91)

## 2017-05-29 LAB — RPR: RPR: NONREACTIVE

## 2017-05-29 LAB — HIV ANTIBODY (ROUTINE TESTING W REFLEX): HIV Screen 4th Generation wRfx: NONREACTIVE

## 2017-05-31 ENCOUNTER — Ambulatory Visit (HOSPITAL_COMMUNITY)
Admission: RE | Admit: 2017-05-31 | Discharge: 2017-05-31 | Disposition: A | Discharge status: Home / Self Care | Payer: Medicaid Other | Source: Ambulatory Visit | Attending: Obstetrics | Admitting: Obstetrics

## 2017-05-31 ENCOUNTER — Other Ambulatory Visit (HOSPITAL_COMMUNITY): Payer: Self-pay | Admitting: *Deleted

## 2017-05-31 DIAGNOSIS — O289 Unspecified abnormal findings on antenatal screening of mother: Secondary | ICD-10-CM | POA: Insufficient documentation

## 2017-05-31 DIAGNOSIS — Z362 Encounter for other antenatal screening follow-up: Secondary | ICD-10-CM | POA: Insufficient documentation

## 2017-05-31 DIAGNOSIS — O281 Abnormal biochemical finding on antenatal screening of mother: Secondary | ICD-10-CM | POA: Insufficient documentation

## 2017-05-31 DIAGNOSIS — Z3A29 29 weeks gestation of pregnancy: Secondary | ICD-10-CM | POA: Diagnosis not present

## 2017-06-11 ENCOUNTER — Encounter: Payer: Medicaid Other | Admitting: Obstetrics

## 2017-07-06 ENCOUNTER — Telehealth: Payer: Self-pay

## 2017-07-06 NOTE — Telephone Encounter (Signed)
Left message for pt to call office to schedule appt.. Pt no showed for appt on 11/19

## 2017-07-09 ENCOUNTER — Ambulatory Visit (INDEPENDENT_AMBULATORY_CARE_PROVIDER_SITE_OTHER): Payer: Medicaid Other | Admitting: Obstetrics

## 2017-07-09 ENCOUNTER — Other Ambulatory Visit (HOSPITAL_COMMUNITY)
Admission: RE | Admit: 2017-07-09 | Discharge: 2017-07-09 | Disposition: A | Discharge status: Home / Self Care | Payer: Medicaid Other | Source: Ambulatory Visit | Attending: Obstetrics | Admitting: Obstetrics

## 2017-07-09 ENCOUNTER — Encounter: Payer: Self-pay | Admitting: Obstetrics

## 2017-07-09 VITALS — BP 113/60 | HR 84 | Wt 155.0 lb

## 2017-07-09 DIAGNOSIS — O98819 Other maternal infectious and parasitic diseases complicating pregnancy, unspecified trimester: Secondary | ICD-10-CM | POA: Insufficient documentation

## 2017-07-09 DIAGNOSIS — Z3400 Encounter for supervision of normal first pregnancy, unspecified trimester: Secondary | ICD-10-CM | POA: Diagnosis present

## 2017-07-09 DIAGNOSIS — B373 Candidiasis of vulva and vagina: Secondary | ICD-10-CM | POA: Diagnosis not present

## 2017-07-09 DIAGNOSIS — Z3403 Encounter for supervision of normal first pregnancy, third trimester: Secondary | ICD-10-CM

## 2017-07-09 DIAGNOSIS — Z3A00 Weeks of gestation of pregnancy not specified: Secondary | ICD-10-CM | POA: Diagnosis not present

## 2017-07-09 NOTE — Progress Notes (Signed)
Subjective:  Orie FishermanKyshma Roderick is a 26 y.o. G1P0 at 644w5d being seen today for ongoing prenatal care.  She is currently monitored for the following issues for this low-risk pregnancy and has Supervision of normal first pregnancy, antepartum; Beta hemolytic strep UTI complicating pregnancy; Abnormal MSAFP (maternal serum alpha-fetoprotein), elevated; and [redacted] weeks gestation of pregnancy on their problem list.  Patient reports vaginal irritation.  Contractions: Not present. Vag. Bleeding: None.  Movement: Present. Denies leaking of fluid.   The following portions of the patient's history were reviewed and updated as appropriate: allergies, current medications, past family history, past medical history, past social history, past surgical history and problem list. Problem list updated.  Objective:   Vitals:   07/09/17 0827  BP: 113/60  Pulse: 84  Weight: 155 lb (70.3 kg)    Fetal Status: Fetal Heart Rate (bpm): 138   Movement: Present     General:  Alert, oriented and cooperative. Patient is in no acute distress.  Skin: Skin is warm and dry. No rash noted.   Cardiovascular: Normal heart rate noted  Respiratory: Normal respiratory effort, no problems with respiration noted  Abdomen: Soft, gravid, appropriate for gestational age. Pain/Pressure: Present     Pelvic:  Cervical exam deferred        Extremities: Normal range of motion.  Edema: None  Mental Status: Normal mood and affect. Normal behavior. Normal judgment and thought content.   Urinalysis:      Assessment and Plan:  Pregnancy: G1P0 at 314w5d  1. Supervision of normal first pregnancy, antepartum Rx: - Strep Gp B NAA - Cervicovaginal ancillary only  Preterm labor symptoms and general obstetric precautions including but not limited to vaginal bleeding, contractions, leaking of fluid and fetal movement were reviewed in detail with the patient. Please refer to After Visit Summary for other counseling recommendations.  Return for  ROB.   Brock BadHarper, Myca Perno A, MD

## 2017-07-09 NOTE — Progress Notes (Signed)
ROB W/ C/O: vaginal discharge believes she has a BV infection.wants to be checked.

## 2017-07-10 LAB — CERVICOVAGINAL ANCILLARY ONLY
BACTERIAL VAGINITIS: NEGATIVE
Candida vaginitis: POSITIVE — AB
Chlamydia: NEGATIVE
Neisseria Gonorrhea: NEGATIVE

## 2017-07-11 ENCOUNTER — Encounter: Payer: Self-pay | Admitting: *Deleted

## 2017-07-11 ENCOUNTER — Other Ambulatory Visit: Payer: Self-pay | Admitting: Obstetrics

## 2017-07-11 DIAGNOSIS — B373 Candidiasis of vulva and vagina: Secondary | ICD-10-CM

## 2017-07-11 DIAGNOSIS — B3731 Acute candidiasis of vulva and vagina: Secondary | ICD-10-CM

## 2017-07-11 LAB — STREP GP B NAA: STREP GROUP B AG: POSITIVE — AB

## 2017-07-11 MED ORDER — FLUCONAZOLE 150 MG PO TABS: 150.0000 mg | ORAL_TABLET | Freq: Once | ORAL | 0 refills | Status: AC

## 2017-07-12 ENCOUNTER — Ambulatory Visit (HOSPITAL_COMMUNITY)
Admission: RE | Admit: 2017-07-12 | Discharge: 2017-07-12 | Disposition: A | Discharge status: Home / Self Care | Payer: Medicaid Other | Source: Ambulatory Visit | Attending: Obstetrics | Admitting: Obstetrics

## 2017-07-12 ENCOUNTER — Other Ambulatory Visit (HOSPITAL_COMMUNITY): Payer: Self-pay | Admitting: *Deleted

## 2017-07-12 ENCOUNTER — Other Ambulatory Visit (HOSPITAL_COMMUNITY): Payer: Self-pay | Admitting: Maternal and Fetal Medicine

## 2017-07-12 ENCOUNTER — Encounter (HOSPITAL_COMMUNITY): Payer: Self-pay

## 2017-07-12 DIAGNOSIS — Z3A35 35 weeks gestation of pregnancy: Secondary | ICD-10-CM

## 2017-07-12 DIAGNOSIS — O365930 Maternal care for other known or suspected poor fetal growth, third trimester, not applicable or unspecified: Secondary | ICD-10-CM | POA: Diagnosis present

## 2017-07-12 DIAGNOSIS — O280 Abnormal hematological finding on antenatal screening of mother: Secondary | ICD-10-CM

## 2017-07-12 DIAGNOSIS — O281 Abnormal biochemical finding on antenatal screening of mother: Secondary | ICD-10-CM

## 2017-07-18 ENCOUNTER — Ambulatory Visit (INDEPENDENT_AMBULATORY_CARE_PROVIDER_SITE_OTHER): Payer: Medicaid Other | Admitting: Obstetrics

## 2017-07-18 ENCOUNTER — Encounter: Payer: Self-pay | Admitting: Obstetrics

## 2017-07-18 VITALS — BP 120/78 | HR 66 | Wt 154.0 lb

## 2017-07-18 DIAGNOSIS — Z3400 Encounter for supervision of normal first pregnancy, unspecified trimester: Secondary | ICD-10-CM

## 2017-07-18 DIAGNOSIS — Z3403 Encounter for supervision of normal first pregnancy, third trimester: Secondary | ICD-10-CM

## 2017-07-18 NOTE — Progress Notes (Signed)
Subjective:  Shannon Reynolds is a 26 y.o. G1P0 at 2447w0d being seen today for ongoing prenatal care.  She is currently monitored for the following issues for this low-risk pregnancy and has Supervision of normal first pregnancy, antepartum; Beta hemolytic strep UTI complicating pregnancy; Abnormal MSAFP (maternal serum alpha-fetoprotein), elevated; and [redacted] weeks gestation of pregnancy on their problem list.  Patient reports no complaints.  Contractions: Not present. Vag. Bleeding: None.  Movement: Present. Denies leaking of fluid.   The following portions of the patient's history were reviewed and updated as appropriate: allergies, current medications, past family history, past medical history, past social history, past surgical history and problem list. Problem list updated.  Objective:   Vitals:   07/18/17 1128  BP: 120/78  Pulse: 66  Weight: 154 lb (69.9 kg)    Fetal Status: Fetal Heart Rate (bpm): 140   Movement: Present     General:  Alert, oriented and cooperative. Patient is in no acute distress.  Skin: Skin is warm and dry. No rash noted.   Cardiovascular: Normal heart rate noted  Respiratory: Normal respiratory effort, no problems with respiration noted  Abdomen: Soft, gravid, appropriate for gestational age. Pain/Pressure: Present     Pelvic:  Cervical exam deferred        Extremities: Normal range of motion.     Mental Status: Normal mood and affect. Normal behavior. Normal judgment and thought content.   Urinalysis:      Assessment and Plan:  Pregnancy: G1P0 at 5947w0d  1. Supervision of normal first pregnancy, antepartum - doing well  Preterm labor symptoms and general obstetric precautions including but not limited to vaginal bleeding, contractions, leaking of fluid and fetal movement were reviewed in detail with the patient. Please refer to After Visit Summary for other counseling recommendations.  Return in about 4 weeks (around 08/15/2017) for ROB, 2 hour  OGTT.   Brock BadHarper, Devinn Voshell A, MD

## 2017-07-20 ENCOUNTER — Encounter (HOSPITAL_COMMUNITY): Payer: Self-pay

## 2017-07-20 ENCOUNTER — Other Ambulatory Visit (HOSPITAL_COMMUNITY): Payer: Self-pay | Admitting: Maternal and Fetal Medicine

## 2017-07-20 ENCOUNTER — Ambulatory Visit (HOSPITAL_COMMUNITY)
Admission: RE | Admit: 2017-07-20 | Discharge: 2017-07-20 | Disposition: A | Discharge status: Home / Self Care | Payer: Medicaid Other | Source: Ambulatory Visit | Attending: Obstetrics | Admitting: Obstetrics

## 2017-07-20 DIAGNOSIS — Z3A36 36 weeks gestation of pregnancy: Secondary | ICD-10-CM | POA: Diagnosis not present

## 2017-07-20 DIAGNOSIS — O280 Abnormal hematological finding on antenatal screening of mother: Secondary | ICD-10-CM

## 2017-07-20 DIAGNOSIS — O365930 Maternal care for other known or suspected poor fetal growth, third trimester, not applicable or unspecified: Secondary | ICD-10-CM | POA: Insufficient documentation

## 2017-07-24 NOTE — L&D Delivery Note (Signed)
Patient is 27 y.o. G1P1001 7395w0d admitted for IOL for FGR. S/p IOL with foley bulb, cytotec. AROM 5 minutes prior to delivery. Prenatal course also complicated by FGR.  Delivery Note At 7:31 PM a viable female was delivered via Vaginal, Spontaneous (Presentation: LOA).  APGAR: 8, 9; weight 5 lb 1 oz (2295 g).   Placenta status: Intact, .  Cord: 3V with the following complications: None.  Cord pH: N/A  Anesthesia: None  Episiotomy: None Lacerations: None Est. Blood Loss (mL): 400  Precipitous change and delivery. Pitocin was not in room so IM Methergine given with uterine massage to help with bleeding. Pitocin bolus given afterwards with good uterine tone and decreased bleeding.   Mom to postpartum.  Baby to Couplet care / Skin to Skin.  Caryl AdaJazma Phelps, DO OB Fellow

## 2017-07-27 ENCOUNTER — Ambulatory Visit (HOSPITAL_COMMUNITY)
Admission: RE | Admit: 2017-07-27 | Discharge: 2017-07-27 | Disposition: A | Discharge status: Home / Self Care | Payer: Medicaid Other | Source: Ambulatory Visit | Attending: Obstetrics | Admitting: Obstetrics

## 2017-07-27 ENCOUNTER — Ambulatory Visit (INDEPENDENT_AMBULATORY_CARE_PROVIDER_SITE_OTHER): Payer: Medicaid Other | Admitting: Obstetrics

## 2017-07-27 ENCOUNTER — Encounter: Payer: Self-pay | Admitting: Obstetrics

## 2017-07-27 ENCOUNTER — Encounter (HOSPITAL_COMMUNITY): Payer: Self-pay

## 2017-07-27 VITALS — BP 125/72 | HR 81 | Wt 156.0 lb

## 2017-07-27 DIAGNOSIS — O280 Abnormal hematological finding on antenatal screening of mother: Secondary | ICD-10-CM

## 2017-07-27 DIAGNOSIS — O365930 Maternal care for other known or suspected poor fetal growth, third trimester, not applicable or unspecified: Secondary | ICD-10-CM | POA: Insufficient documentation

## 2017-07-27 DIAGNOSIS — Z3A37 37 weeks gestation of pregnancy: Secondary | ICD-10-CM | POA: Diagnosis not present

## 2017-07-27 DIAGNOSIS — O365990 Maternal care for other known or suspected poor fetal growth, unspecified trimester, not applicable or unspecified: Secondary | ICD-10-CM

## 2017-07-27 DIAGNOSIS — Z3403 Encounter for supervision of normal first pregnancy, third trimester: Secondary | ICD-10-CM

## 2017-07-27 DIAGNOSIS — Z3400 Encounter for supervision of normal first pregnancy, unspecified trimester: Secondary | ICD-10-CM

## 2017-07-27 DIAGNOSIS — O288 Other abnormal findings on antenatal screening of mother: Secondary | ICD-10-CM

## 2017-07-27 NOTE — Progress Notes (Signed)
Subjective:  Shannon Reynolds is a 27 y.o. G1P0 at [redacted]w[redacted]d being seen today for ongoing prenatal care.  She is currently monitored for the following issues for this low-risk pregnancy and has Supervision of normal first pregnancy, antepartum; Beta hemolytic strep UTI complicating pregnancy; Abnormal MSAFP (maternal serum alpha-fetoprotein), elevated; and [redacted] weeks gestation of pregnancy on their problem list.  Patient reports no complaints.  Contractions: Not present. Vag. Bleeding: None.  Movement: Present. Denies leaking of fluid.   The following portions of the patient's history were reviewed and updated as appropriate: allergies, current medications, past family history, past medical history, past social history, past surgical history and problem list. Problem list updated.  Objective:   Vitals:   07/27/17 0930  BP: 125/72  Pulse: 81  Weight: 156 lb (70.8 kg)    Fetal Status: Fetal Heart Rate (bpm): 140   Movement: Present     General:  Alert, oriented and cooperative. Patient is in no acute distress.  Skin: Skin is warm and dry. No rash noted.   Cardiovascular: Normal heart rate noted  Respiratory: Normal respiratory effort, no problems with respiration noted  Abdomen: Soft, gravid, appropriate for gestational age. Pain/Pressure: Present     Pelvic:  Cervical exam deferred        Extremities: Normal range of motion.  Edema: None  Mental Status: Normal mood and affect. Normal behavior. Normal judgment and thought content.   Urinalysis:      BPP:  Assessment and Plan:  Pregnancy: G1P0 at [redacted]w[redacted]d  1. Supervision of normal first pregnancy, antepartum  2. Abnormal MSAFP (maternal serum alpha-fetoprotein), elevated  - following growth  3. SGA fetus with low normal AFI  - interval growth q 3-4 weeks - weekly BPP's with UA Dopplers       [place] MFM OB FOLLOW UP (Accession [id number]) (Order [id number])  Imaging  Date: 07/12/2017 Department: [facility] MATERNAL FETAL CARE  ULTRASOUND Released By: Lenoard Aden, RDMS Authorizing: Particia Nearing, MD  Exam Information   Status Exam Begun  Exam Ended   Final [99] 07/12/2017 11:04 AM 07/12/2017 12:43 PM  PACS Images   Show images for [place] MFM OB FOLLOW UP  Study Result   ----------------------------------------------------------------------  OBSTETRICS REPORT                      (Signed Final 07/12/2017 11:30 pm) ---------------------------------------------------------------------- Patient Info  ID #:       [id number]                          D.O.B.:  May 05, 1991 (26 yrs)  Name:       Shannon Reynolds                  Visit Date: 07/12/2017 11:02 am ---------------------------------------------------------------------- Performed By  Performed By:     Tommi Emery         Ref. Address:      [address]                    RDMS                                                              [address] [place] [address]  [place], [place]                                                              [address]  Attending:        Particia Nearing MD       Location:          [facility]  Referred By:      Brock Bad MD ---------------------------------------------------------------------- Orders   #  Description                                 Code   1  [place] MFM OB FOLLOW UP                         [phone]   2  [place] MFM FETAL BPP WO NON STRESS              76819.01   3  [place] MFM UA CORD DOPPLER                      76820.02  ----------------------------------------------------------------------   #  Ordered By               Order #        Accession #    Episode #   1  Particia Nearing            [id number]      [id number]     [id number]   2  MARTHA DECKER            [id number]      [id number]     [id number]   3  MARTHA DECKER            [id number]      [id number]     [id number]   ---------------------------------------------------------------------- Indications   [redacted] weeks gestation of pregnancy                Z3A.35   Elevated MSAFP (2.8 MoM)                       O28.9   Small for gestational age fetus affecting      O33.5990   management of mother  ---------------------------------------------------------------------- OB History  Blood Type:            Height:  5'2"   Weight (lb):  140       BMI:  25.6  Gravidity:    1 ---------------------------------------------------------------------- Fetal Evaluation  Num Of Fetuses:     1  Fetal Heart         140  Rate(bpm):  Cardiac Activity:   Observed  Presentation:       Cephalic  Placenta:           Anterior, above cervical os  P. Cord Insertion:  Previously Visualized  Amniotic Fluid  AFI FV:      Subjectively within normal limits  AFI Sum(cm)     %Tile       Largest Pocket(cm)  10.66  26          4.51  RUQ(cm)                     LUQ(cm)        LLQ(cm)  3.48                        4.51           2.67 ---------------------------------------------------------------------- Biophysical Evaluation  Amniotic F.V:   Within normal limits       F. Tone:         Observed  F. Movement:    Observed                   Score:           8/8  F. Breathing:   Observed ---------------------------------------------------------------------- Biometry  BPD:      85.7  mm     G. Age:  34w 4d         36  %    CI:        79.78   %    70 - 86                                                          FL/HC:       21.6  %    20.1 - 22.3  HC:      303.2  mm     G. Age:  33w 5d        < 3  %    HC/AC:       1.05       0.93 - 1.11  AC:      288.8  mm     G. Age:  32w 6d          7  %    FL/BPD:      76.5  %    71 - 87  FL:       65.6  mm     G. Age:  33w 6d         14  %    FL/AC:       22.7  %    20 - 24  HUM:      61.4  mm     G. Age:  35w 4d         74  %  Est. FW:    2184   gm    4 lb 13 oz     25   % ---------------------------------------------------------------------- Gestational Age  LMP:           36w 3d        Date:  10/30/16                 EDD:   08/06/17  U/S Today:     33w 5d                                        EDD:   08/25/17  Best:          35w 1d     Det. By:  U/S  (  03/22/17)          EDD:   08/15/17 ---------------------------------------------------------------------- Anatomy  Cranium:               Appears normal         Aortic Arch:            Previously seen  Cavum:                 Previously seen        Ductal Arch:            Previously seen  Ventricles:            Appears normal         Diaphragm:              Appears normal  Choroid Plexus:        Previously seen        Stomach:                Appears normal, left                                                                        sided  Cerebellum:            Previously seen        Abdomen:                Appears normal  Posterior Fossa:       Previously seen        Abdominal Wall:         Previously seen  Nuchal Fold:           Previously seen        Cord Vessels:           Previously seen  Face:                  Orbits and profile     Kidneys:                Appear normal                         previously seen  Lips:                  Previously seen        Bladder:                Appears normal  Thoracic:              Previously seen        Spine:                  Previously seen  Heart:                 Previously seen        Upper Extremities:      Previously seen  RVOT:                  Previously seen        Lower Extremities:      Previously seen  LVOT:  Previously seen  Other:  Female gender. Technically difficult due to fetal position. ---------------------------------------------------------------------- Doppler - Fetal Vessels  Umbilical Artery   S/D     %tile     RI              PI              PSV    ADFV    RDFV                                                   (cm/s)  2.02         23   0.51            0.69             45.71      No      No ---------------------------------------------------------------------- Cervix Uterus Adnexa  Cervix  Not visualized (advanced GA >29wks)  Uterus  No abnormality visualized.  Left Ovary  Not visualized.  Right Ovary  Not visualized.  Cul De Sac:   No free fluid seen.  Adnexa:       No abnormality visualized. ---------------------------------------------------------------------- Impression  SIUP at 35+1 weeks  Normal interval anatomy; anatomic survey complete  Normal amniotic fluid volume  EFW at the 25th %tile; AC at the 7th %tile  UA dopplers were normal for this GA  BPP 8/8 ---------------------------------------------------------------------- Recommendations  Weekly BPPs ----------------------------------------------------------------------                 Particia NearingMartha Decker, MD Electronically Signed Final Report   07/12/2017 11:30 pm    ----------------------------------------------------------------------       US MFM FETAL BPP WO NON STRESS (Accession [id number]) (Order [id number])  Imaging  Date: 07/20/2017 [facility] ULTRASOUND Released By: Genevie CheshireWaken, Heather M, RT Authorizing: Particia Nearingecker, Martha, MD  Exam Information   Status Exam Begun  Exam Ended   Final [99] 07/20/2017 12:58 PM 07/20/2017 1:19 PM  PACS Images   Show images for US MFM FETAL BPP WO NON STRESS  Study Result   ----------------------------------------------------------------------  OBSTETRICS REPORT                      (Signed Final 07/20/2017 01:26 pm) ---------------------------------------------------------------------- Patient Info  ID #:       [id number]                          D.O.B.:  Mar 24, 1991 (26 yrs)  Name:       Shannon FishermanKYSHMA Reynolds                  Visit Date: 07/20/2017 01:03 pm ---------------------------------------------------------------------- Performed By  Performed By:     Percell BostonHeather Waken           Ref. Address:     [address]                    [address], [place]  [address]                                                             [place], [place]                                                             [address]  Attending:        Durwin Nora       Location:         [facility]                    MD  Referred By:      Brock Bad MD ---------------------------------------------------------------------- Orders   #  Description                                 Code   1  [place] MFM FETAL BPP WO NON STRESS              [phone]  ----------------------------------------------------------------------   #  Ordered By               Order #        Accession #    Episode #   1  Particia Nearing            [id number]      [id number]     [id number]  ---------------------------------------------------------------------- Indications   [redacted] weeks gestation of pregnancy                Z3A.36   Elevated MSAFP (2.8 MoM)                       O28.9   Small for gestational age fetus affecting      O6.5990   management of mother  ---------------------------------------------------------------------- OB History  Blood Type:            Height:  5'2"   Weight (lb):  140       BMI:  25.6  Gravidity:    1 ---------------------------------------------------------------------- Fetal Evaluation  Num Of Fetuses:     1  Fetal Heart         137  Rate(bpm):  Cardiac Activity:   Observed  Presentation:       Cephalic  Amniotic Fluid  AFI FV:      Subjectively within normal limits  AFI Sum(cm)     %Tile       Largest Pocket(cm)  7.3             4           3.72  RUQ(cm)       RLQ(cm)       LUQ(cm)        LLQ(cm)  1.48          0             3.72  2.1 ---------------------------------------------------------------------- Biophysical Evaluation  Amniotic F.V:   Within normal limits       F. Tone:        Observed   F. Movement:    Observed                   Score:          8/8  F. Breathing:   Observed ---------------------------------------------------------------------- Gestational Age  LMP:           37w 4d        Date:  10/30/16                 EDD:   08/06/17  Best:          Stevie Kern 2d     Det. By:  U/S  (03/22/17)          EDD:   08/15/17 ---------------------------------------------------------------------- Impression  SIUP at [redacted]w[redacted]d, lagging AC (7th%'le)  active fetus  BPP 8/8  AFI is low normal ---------------------------------------------------------------------- Recommendations  Given low normal AFI, repeat AFI/BPP in 1 week as  previously scheduled. ----------------------------------------------------------------------               Durwin Nora, MD Electronically Signed Final Report   07/20/2017 01:26 pm ----------------------------------------------------------------------   Term labor symptoms and general obstetric precautions including but not limited to vaginal bleeding, contractions, leaking of fluid and fetal movement were reviewed in detail with the patient. Please refer to After Visit Summary for other counseling recommendations.  Return in about 1 week (around 08/03/2017) for ROB.   Brock Bad, MD

## 2017-07-30 ENCOUNTER — Ambulatory Visit (INDEPENDENT_AMBULATORY_CARE_PROVIDER_SITE_OTHER): Payer: Self-pay | Admitting: Pediatrics

## 2017-07-30 DIAGNOSIS — Z7681 Expectant parent(s) prebirth pediatrician visit: Secondary | ICD-10-CM

## 2017-08-03 ENCOUNTER — Encounter (HOSPITAL_COMMUNITY): Payer: Self-pay

## 2017-08-03 ENCOUNTER — Ambulatory Visit (HOSPITAL_COMMUNITY)
Admission: RE | Admit: 2017-08-03 | Discharge: 2017-08-03 | Disposition: A | Discharge status: Home / Self Care | Payer: Medicaid Other | Source: Ambulatory Visit | Attending: Obstetrics | Admitting: Obstetrics

## 2017-08-03 ENCOUNTER — Other Ambulatory Visit (HOSPITAL_COMMUNITY): Payer: Self-pay | Admitting: Maternal and Fetal Medicine

## 2017-08-03 ENCOUNTER — Encounter: Payer: Self-pay | Admitting: Obstetrics

## 2017-08-03 ENCOUNTER — Ambulatory Visit (INDEPENDENT_AMBULATORY_CARE_PROVIDER_SITE_OTHER): Payer: Medicaid Other | Admitting: Obstetrics

## 2017-08-03 VITALS — BP 123/73 | HR 66 | Wt 156.0 lb

## 2017-08-03 DIAGNOSIS — O280 Abnormal hematological finding on antenatal screening of mother: Secondary | ICD-10-CM

## 2017-08-03 DIAGNOSIS — Z3A38 38 weeks gestation of pregnancy: Secondary | ICD-10-CM | POA: Diagnosis not present

## 2017-08-03 DIAGNOSIS — O365930 Maternal care for other known or suspected poor fetal growth, third trimester, not applicable or unspecified: Secondary | ICD-10-CM

## 2017-08-03 DIAGNOSIS — Z362 Encounter for other antenatal screening follow-up: Secondary | ICD-10-CM | POA: Diagnosis not present

## 2017-08-03 DIAGNOSIS — O365990 Maternal care for other known or suspected poor fetal growth, unspecified trimester, not applicable or unspecified: Secondary | ICD-10-CM | POA: Diagnosis not present

## 2017-08-03 NOTE — Addendum Note (Signed)
Addended by: Coral CeoHARPER, Amarii Bordas A on: 08/03/2017 11:07 AM   Modules accepted: SmartSet

## 2017-08-03 NOTE — Progress Notes (Signed)
Subjective:  Shannon Reynolds is a 27 y.o. G1P0 at [redacted]w[redacted]d being seen today for ongoing prenatal care.  She is currently monitored for the following issues for this high-risk pregnancy and has Supervision of normal first pregnancy, antepartum; Beta hemolytic strep UTI complicating pregnancy; Abnormal MSAFP (maternal serum alpha-fetoprotein), elevated; [redacted] weeks gestation of pregnancy; AFI (amniotic fluid index) borderline low; and Poor fetal growth, affecting management of mother, antepartum condition or complication on their problem list.  Patient reports occasional contractions.  Contractions: Irregular. Vag. Bleeding: None.  Movement: Present. Denies leaking of fluid.   The following portions of the patient's history were reviewed and updated as appropriate: allergies, current medications, past family history, past medical history, past social history, past surgical history and problem list. Problem list updated.  Objective:   Vitals:   08/03/17 1010  BP: 123/73  Pulse: 66  Weight: 156 lb (70.8 kg)    Fetal Status:     Movement: Present     General:  Alert, oriented and cooperative. Patient is in no acute distress.  Skin: Skin is warm and dry. No rash noted.   Cardiovascular: Normal heart rate noted  Respiratory: Normal respiratory effort, no problems with respiration noted  Abdomen: Soft, gravid, appropriate for gestational age. Pain/Pressure: Present     Pelvic:  Cervical exam deferred        Extremities: Normal range of motion.  Edema: None  Mental Status: Normal mood and affect. Normal behavior. Normal judgment and thought content.   Urinalysis:     [place] MFM OB FOLLOW UP (Accession [id number]) (Order [id number])  Imaging  Date: 07/12/2017 Department: [facility] MATERNAL FETAL CARE ULTRASOUND Released By: Lenoard Aden, RDMS Authorizing: Particia Nearing, MD  Exam Information   Status Exam Begun  Exam Ended   Final [99] 07/12/2017 11:04 AM 07/12/2017 12:43 PM  PACS Images    Show images for [place] MFM OB FOLLOW UP  Study Result   ----------------------------------------------------------------------  OBSTETRICS REPORT                      (Signed Final 07/12/2017 11:30 pm) ---------------------------------------------------------------------- Patient Info  ID #:       [id number]                          D.O.B.:  [birth year] (26 yrs)  Name:       Shannon Reynolds                  Visit Date: 07/12/2017 11:02 am ---------------------------------------------------------------------- Performed By  Performed By:     Tommi Emery         Ref. Address:      [address]                    [address]                                                              [address]  [place], [place]                                                              [address]  Attending:        Particia Nearing MD       Location:          [facility]  Referred By:      Brock Bad MD ---------------------------------------------------------------------- Orders   #  Description                                 Code   1  [place] MFM OB FOLLOW UP                         [phone]   2  [place] MFM FETAL BPP WO NON STRESS              76819.01   3  [place] MFM UA CORD DOPPLER                      76820.02  ----------------------------------------------------------------------   #  Ordered By               Order #        Accession #    Episode #   1  Particia Nearing            [id number]      [id number]     [id number]   2  MARTHA DECKER            [id number]      [id number]     [id number]   3  MARTHA DECKER            [id number]      [id number]     [id number]  ---------------------------------------------------------------------- Indications   [redacted] weeks gestation of pregnancy                Z3A.35   Elevated MSAFP (2.8 MoM)                       O28.9   Small for gestational age fetus affecting      O66.5990   management of mother   ---------------------------------------------------------------------- OB History  Blood Type:            Height:  5'2"   Weight (lb):  140       BMI:  25.6  Gravidity:    1 ---------------------------------------------------------------------- Fetal Evaluation  Num Of Fetuses:     1  Fetal Heart         140  Rate(bpm):  Cardiac Activity:   Observed  Presentation:       Cephalic  Placenta:           Anterior, above cervical os  P. Cord Insertion:  Previously Visualized  Amniotic Fluid  AFI FV:      Subjectively within normal limits  AFI Sum(cm)     %Tile       Largest Pocket(cm)  10.66  26          4.51  RUQ(cm)                     LUQ(cm)        LLQ(cm)  3.48                        4.51           2.67 ---------------------------------------------------------------------- Biophysical Evaluation  Amniotic F.V:   Within normal limits       F. Tone:         Observed  F. Movement:    Observed                   Score:           8/8  F. Breathing:   Observed ---------------------------------------------------------------------- Biometry  BPD:      85.7  mm     G. Age:  34w 4d         36  %    CI:        79.78   %    70 - 86                                                          FL/HC:       21.6  %    20.1 - 22.3  HC:      303.2  mm     G. Age:  33w 5d        < 3  %    HC/AC:       1.05       0.93 - 1.11  AC:      288.8  mm     G. Age:  32w 6d          7  %    FL/BPD:      76.5  %    71 - 87  FL:       65.6  mm     G. Age:  33w 6d         14  %    FL/AC:       22.7  %    20 - 24  HUM:      61.4  mm     G. Age:  35w 4d         74  %  Est. FW:    2184   gm    4 lb 13 oz     25  % ---------------------------------------------------------------------- Gestational Age  LMP:           36w 3d        Date:  10/30/16                 EDD:   08/06/17  U/S Today:     33w 5d                                        EDD:   08/25/17  Best:          35w 1d     Det. By:  U/S  (  03/22/17)           EDD:   08/15/17 ---------------------------------------------------------------------- Anatomy  Cranium:               Appears normal         Aortic Arch:            Previously seen  Cavum:                 Previously seen        Ductal Arch:            Previously seen  Ventricles:            Appears normal         Diaphragm:              Appears normal  Choroid Plexus:        Previously seen        Stomach:                Appears normal, left                                                                        sided  Cerebellum:            Previously seen        Abdomen:                Appears normal  Posterior Fossa:       Previously seen        Abdominal Wall:         Previously seen  Nuchal Fold:           Previously seen        Cord Vessels:           Previously seen  Face:                  Orbits and profile     Kidneys:                Appear normal                         previously seen  Lips:                  Previously seen        Bladder:                Appears normal  Thoracic:              Previously seen        Spine:                  Previously seen  Heart:                 Previously seen        Upper Extremities:      Previously seen  RVOT:                  Previously seen        Lower Extremities:      Previously seen  LVOT:  Previously seen  Other:  Female gender. Technically difficult due to fetal position. ---------------------------------------------------------------------- Doppler - Fetal Vessels  Umbilical Artery   S/D     %tile     RI              PI              PSV    ADFV    RDFV                                                   (cm/s)  2.02        23   0.51            0.69             45.71      No      No ---------------------------------------------------------------------- Cervix Uterus Adnexa  Cervix  Not visualized (advanced GA >29wks)  Uterus  No abnormality visualized.  Left Ovary  Not visualized.  Right Ovary  Not visualized.   Cul De Sac:   No free fluid seen.  Adnexa:       No abnormality visualized. ---------------------------------------------------------------------- Impression  SIUP at 35+1 weeks  Normal interval anatomy; anatomic survey complete  Normal amniotic fluid volume  EFW at the 25th %tile; AC at the 7th %tile  UA dopplers were normal for this GA  BPP 8/8 ---------------------------------------------------------------------- Recommendations  Weekly BPPs ----------------------------------------------------------------------                 Particia Nearing, MD Electronically Signed Final Report   07/12/2017 11:30 pm ----------------------------------------------------------------------    Assessment and Plan:  Pregnancy: G1P0 at [redacted]w[redacted]d  1. Poor fetal growth affecting management of mother, antepartum, single or unspecified fetus  - EFW at 25th%tile, low normal AFI, normal UA Dopplers - weekly BPP's. - no recommendation for delivery yet from MFM.  Has a BPP scheduled later today.  2. Abnormal MSAFP (maternal serum alpha-fetoprotein), elevated - normal Anatomic Survey  Term labor symptoms and general obstetric precautions including but not limited to vaginal bleeding, contractions, leaking of fluid and fetal movement were reviewed in detail with the patient. Please refer to After Visit Summary for other counseling recommendations.  Return in about 1 week (around 08/10/2017) for ROB.   Brock Bad, MD

## 2017-08-06 ENCOUNTER — Telehealth (HOSPITAL_COMMUNITY): Payer: Self-pay | Admitting: *Deleted

## 2017-08-06 ENCOUNTER — Encounter (HOSPITAL_COMMUNITY): Payer: Self-pay | Admitting: *Deleted

## 2017-08-06 NOTE — Telephone Encounter (Signed)
Preadmission screen  

## 2017-08-06 NOTE — Progress Notes (Signed)
Prenatal counseling for impending newborn done--1st child, no complications, currently 37wks, prenatal started 6wks 51Z76.81

## 2017-08-08 ENCOUNTER — Encounter (HOSPITAL_COMMUNITY): Payer: Self-pay

## 2017-08-08 ENCOUNTER — Inpatient Hospital Stay (HOSPITAL_COMMUNITY)
Admission: RE | Admit: 2017-08-08 | Discharge: 2017-08-10 | DRG: 807 | Disposition: A | Discharge status: Home / Self Care | Payer: Medicaid Other | Source: Ambulatory Visit | Attending: Family Medicine | Admitting: Family Medicine

## 2017-08-08 DIAGNOSIS — Z3A39 39 weeks gestation of pregnancy: Secondary | ICD-10-CM

## 2017-08-08 DIAGNOSIS — O365930 Maternal care for other known or suspected poor fetal growth, third trimester, not applicable or unspecified: Principal | ICD-10-CM | POA: Diagnosis present

## 2017-08-08 DIAGNOSIS — O99824 Streptococcus B carrier state complicating childbirth: Secondary | ICD-10-CM | POA: Diagnosis present

## 2017-08-08 DIAGNOSIS — IMO0002 Reserved for concepts with insufficient information to code with codable children: Secondary | ICD-10-CM | POA: Diagnosis present

## 2017-08-08 LAB — CBC
HEMATOCRIT: 33.2 % — AB (ref 36.0–46.0)
HEMOGLOBIN: 10.9 g/dL — AB (ref 12.0–15.0)
MCH: 26.5 pg (ref 26.0–34.0)
MCHC: 32.8 g/dL (ref 30.0–36.0)
MCV: 80.8 fL (ref 78.0–100.0)
Platelets: 159 10*3/uL (ref 150–400)
RBC: 4.11 MIL/uL (ref 3.87–5.11)
RDW: 14.6 % (ref 11.5–15.5)
WBC: 14.1 10*3/uL — ABNORMAL HIGH (ref 4.0–10.5)

## 2017-08-08 LAB — ABO/RH: ABO/RH(D): O POS

## 2017-08-08 LAB — TYPE AND SCREEN
ABO/RH(D): O POS
ANTIBODY SCREEN: NEGATIVE

## 2017-08-08 MED ORDER — ACETAMINOPHEN 325 MG PO TABS: 650.0000 mg | ORAL_TABLET | ORAL | Status: DC | PRN

## 2017-08-08 MED ORDER — SODIUM CHLORIDE 0.9 % IV SOLN
2.0000 g | Freq: Once | INTRAVENOUS | Status: AC
  Administered 2017-08-08: 2 g via INTRAVENOUS
  Filled 2017-08-08: qty 2000

## 2017-08-08 MED ORDER — OXYTOCIN BOLUS FROM INFUSION
500.0000 mL | Freq: Once | INTRAVENOUS | Status: AC
  Administered 2017-08-08: 500 mL via INTRAVENOUS

## 2017-08-08 MED ORDER — DIBUCAINE 1 % RE OINT: 1.0000 "application " | TOPICAL_OINTMENT | RECTAL | Status: DC | PRN

## 2017-08-08 MED ORDER — BENZOCAINE-MENTHOL 20-0.5 % EX AERO: 1.0000 "application " | INHALATION_SPRAY | CUTANEOUS | Status: DC | PRN

## 2017-08-08 MED ORDER — IBUPROFEN 600 MG PO TABS
600.0000 mg | ORAL_TABLET | Freq: Four times a day (QID) | ORAL | Status: DC
  Administered 2017-08-09 – 2017-08-10 (×6): 600 mg via ORAL
  Filled 2017-08-08 (×5): qty 1

## 2017-08-08 MED ORDER — MISOPROSTOL 50MCG HALF TABLET
50.0000 ug | ORAL_TABLET | ORAL | Status: DC | PRN
  Administered 2017-08-08: 50 ug via BUCCAL
  Filled 2017-08-08 (×2): qty 1

## 2017-08-08 MED ORDER — OXYCODONE-ACETAMINOPHEN 5-325 MG PO TABS: 1.0000 | ORAL_TABLET | ORAL | Status: DC | PRN

## 2017-08-08 MED ORDER — OXYTOCIN 40 UNITS IN LACTATED RINGERS INFUSION - SIMPLE MED
2.5000 [IU]/h | INTRAVENOUS | Status: DC
  Administered 2017-08-08: 2.5 [IU]/h via INTRAVENOUS
  Filled 2017-08-08: qty 1000

## 2017-08-08 MED ORDER — ERYTHROMYCIN 5 MG/GM OP OINT
TOPICAL_OINTMENT | OPHTHALMIC | Status: AC
  Filled 2017-08-08: qty 1

## 2017-08-08 MED ORDER — LIDOCAINE HCL (PF) 1 % IJ SOLN
30.0000 mL | INTRAMUSCULAR | Status: DC | PRN
  Filled 2017-08-08: qty 30

## 2017-08-08 MED ORDER — SOD CITRATE-CITRIC ACID 500-334 MG/5ML PO SOLN: 30.0000 mL | ORAL | Status: DC | PRN

## 2017-08-08 MED ORDER — ONDANSETRON HCL 4 MG/2ML IJ SOLN: 4.0000 mg | Freq: Four times a day (QID) | INTRAMUSCULAR | Status: DC | PRN

## 2017-08-08 MED ORDER — TERBUTALINE SULFATE 1 MG/ML IJ SOLN
0.2500 mg | Freq: Once | INTRAMUSCULAR | Status: DC | PRN
  Filled 2017-08-08: qty 1

## 2017-08-08 MED ORDER — ONDANSETRON HCL 4 MG PO TABS: 4.0000 mg | ORAL_TABLET | ORAL | Status: DC | PRN

## 2017-08-08 MED ORDER — WITCH HAZEL-GLYCERIN EX PADS: 1.0000 "application " | MEDICATED_PAD | CUTANEOUS | Status: DC | PRN

## 2017-08-08 MED ORDER — PRENATAL MULTIVITAMIN CH
1.0000 | ORAL_TABLET | Freq: Every day | ORAL | Status: DC
  Administered 2017-08-09: 1 via ORAL
  Filled 2017-08-08: qty 1

## 2017-08-08 MED ORDER — TETANUS-DIPHTH-ACELL PERTUSSIS 5-2.5-18.5 LF-MCG/0.5 IM SUSP: 0.5000 mL | Freq: Once | INTRAMUSCULAR | Status: DC

## 2017-08-08 MED ORDER — COCONUT OIL OIL: 1.0000 "application " | TOPICAL_OIL | Status: DC | PRN

## 2017-08-08 MED ORDER — SIMETHICONE 80 MG PO CHEW: 80.0000 mg | CHEWABLE_TABLET | ORAL | Status: DC | PRN

## 2017-08-08 MED ORDER — MISOPROSTOL 25 MCG QUARTER TABLET
25.0000 ug | ORAL_TABLET | ORAL | Status: DC | PRN
  Administered 2017-08-08 (×2): 25 ug via VAGINAL
  Filled 2017-08-08 (×2): qty 1

## 2017-08-08 MED ORDER — METHYLERGONOVINE MALEATE 0.2 MG/ML IJ SOLN
INTRAMUSCULAR | Status: AC
  Administered 2017-08-08: 0.2 mg via INTRAMUSCULAR
  Filled 2017-08-08: qty 1

## 2017-08-08 MED ORDER — OXYCODONE-ACETAMINOPHEN 5-325 MG PO TABS: 2.0000 | ORAL_TABLET | ORAL | Status: DC | PRN

## 2017-08-08 MED ORDER — LACTATED RINGERS IV SOLN: 500.0000 mL | INTRAVENOUS | Status: DC | PRN

## 2017-08-08 MED ORDER — SENNOSIDES-DOCUSATE SODIUM 8.6-50 MG PO TABS
2.0000 | ORAL_TABLET | ORAL | Status: DC
  Administered 2017-08-09 (×2): 2 via ORAL
  Filled 2017-08-08 (×3): qty 2

## 2017-08-08 MED ORDER — ZOLPIDEM TARTRATE 5 MG PO TABS: 5.0000 mg | ORAL_TABLET | Freq: Every evening | ORAL | Status: DC | PRN

## 2017-08-08 MED ORDER — ONDANSETRON HCL 4 MG/2ML IJ SOLN: 4.0000 mg | INTRAMUSCULAR | Status: DC | PRN

## 2017-08-08 MED ORDER — METHYLERGONOVINE MALEATE 0.2 MG/ML IJ SOLN
0.2000 mg | Freq: Once | INTRAMUSCULAR | Status: AC
  Administered 2017-08-08: 0.2 mg via INTRAMUSCULAR

## 2017-08-08 MED ORDER — LACTATED RINGERS IV SOLN: INTRAVENOUS | Status: DC

## 2017-08-08 MED ORDER — FENTANYL CITRATE (PF) 100 MCG/2ML IJ SOLN
100.0000 ug | INTRAMUSCULAR | Status: DC | PRN
  Administered 2017-08-08: 100 ug via INTRAVENOUS
  Filled 2017-08-08 (×2): qty 2

## 2017-08-08 MED ORDER — DIPHENHYDRAMINE HCL 25 MG PO CAPS: 25.0000 mg | ORAL_CAPSULE | Freq: Four times a day (QID) | ORAL | Status: DC | PRN

## 2017-08-08 MED ORDER — DEXTROSE 5 % IV SOLN
5.0000 10*6.[IU] | Freq: Once | INTRAVENOUS | Status: AC
  Administered 2017-08-08: 5 10*6.[IU] via INTRAVENOUS
  Filled 2017-08-08: qty 5

## 2017-08-08 MED ORDER — FLEET ENEMA 7-19 GM/118ML RE ENEM: 1.0000 | ENEMA | RECTAL | Status: DC | PRN

## 2017-08-08 MED ORDER — PENICILLIN G POT IN DEXTROSE 60000 UNIT/ML IV SOLN
3.0000 10*6.[IU] | INTRAVENOUS | Status: DC
  Filled 2017-08-08 (×4): qty 50

## 2017-08-08 NOTE — Progress Notes (Signed)
Patient ID: Shannon Reynolds, female   DOB: 10/29/1990, 27 y.o.   MRN: [id number]   Patient here for IOL. S/p cyotec. Cervix is now 1cm. Placed foley bulb and inflated with 60cc. Discussed increased contractions/cramping with placement.   Federico FlakeKimberly Niles Donivan Thammavong, MD, MPH, ABFM Attending Physician Faculty Practice- [facility]

## 2017-08-08 NOTE — Progress Notes (Signed)
Labor Progress Note Shannon Reynolds is a 27 y.o. G1P0 at 5689w0d presented for IOL for FGR.     S: Patient is lying in bed on left side.  Complains of pain; nursing notified.   O:  BP (!) 110/49   Pulse 65   Temp 98.3 F (36.8 C) (Oral)   Resp 18   Ht 5\' 2"  (1.575 m)   Wt 70.3 kg (155 lb)   LMP 10/30/2016 (Approximate)   BMI 28.35 kg/m  EFM: 130 bpm, moderate variability (> 6 bpm), no accelerations, variable decelerations  CVE: Dilation: Fingertip Effacement (%): Thick Cervical Position: Posterior Station: -2 Presentation: Vertex Exam by:: Newton   A&P: 27 y.o. G1P0 3289w0d presenting for IOL for FGR. #Labor: Progressing well. #Pain: IV pain medication prn qh #FWB: Category 1 #GBS Pos - PCN  Mikal PlaneBenjamin A Malley Hauter, Medical Student 6:39 PM

## 2017-08-08 NOTE — Anesthesia Pain Management Evaluation Note (Signed)
  CRNA Pain Management Visit Note  Patient: Shannon Reynolds, 27 y.o., female  "Hello I am a [facility] of the anesthesia team at [facility]. We have an anesthesia team available at all times to provide care throughout the hospital, including epidural management and anesthesia for C-section. I don't know your plan for the delivery whether it a natural birth, water birth, IV sedation, nitrous supplementation, doula or epidural, but we want to meet your pain goals."   1.Was your pain managed to your expectations on prior hospitalizations?   No prior hospitalizations  2.What is your expectation for pain management during this hospitalization?     Epidural, IV pain meds and Nitrous Oxide  3.How can we help you reach that goal? Be available  Record the patient's initial score and the patient's pain goal.   Pain: 0  Pain Goal: 5 [facility] wants you to be able to say your pain was always managed very well.  [facility],Shannon Reynolds 08/08/2017

## 2017-08-08 NOTE — H&P (Signed)
OBSTETRIC ADMISSION HISTORY AND PHYSICAL  Shannon FishermanKyshma Reynolds is a 27 y.o. female G1P0 with IUP at 5958w0d by u/s presenting for IOL for FGR. She reports +FMs, No LOF, no VB, no blurry vision, headaches or peripheral edema, and RUQ pain.  She plans on breast & bottle feeding. She request OCP vs undecided for birth control. H/o elevated MSAFP. [facility]   Dating: By U/s --->  Estimated Date of Delivery: 08/15/17  Sono:  @[redacted]w[redacted]d , CWD, normal anatomy, 25% EFW, AC at the 7th %  Prenatal History/Complications: .[facility] Prenatal Labs  Dating U/S Blood type: O/Positive/-- (07/17 0913)   Genetic Screen 1 Screen:    AFP: Elevated MSAFP - tetra    Quad:     NIPS: Antibody:Negative (07/17 0913)  Anatomic US  Normal Rubella: 4.05 (07/17 0913)  GTT Early:               Third trimester:  RPR: Non Reactive (11/05 1150)   Flu vaccine Declined 05/28/17 HBsAg: Negative (07/17 0913)   TDaP vaccine  05/28/17                                             Rhogam: HIV: Non Reactive (11/05 1150)   Baby Food   Breast/Bottle                                         ZOX:[id number]:Positive (12/17 0936)(For PCN allergy, check sensitivities)  Contraception   undecided Pap: 02/06/17 abn  Circumcision  yes   Pediatrician   undecided   Support Person FOB   Prenatal Classes  no     Patient Active Problem List   Diagnosis Date Noted  . Poor fetal growth 08/08/2017  . SVD (spontaneous vaginal delivery) 08/08/2017  . AFI (amniotic fluid index) borderline low 07/27/2017  . Poor fetal growth, affecting management of mother, antepartum condition or complication 07/27/2017  . Abnormal MSAFP (maternal serum alpha-fetoprotein), elevated 03/22/2017  . [redacted] weeks gestation of pregnancy   . Beta hemolytic strep UTI complicating pregnancy 02/12/2017  . Supervision of normal first pregnancy, antepartum 02/06/2017    Past Medical History: Past Medical History:  Diagnosis Date  . Medical history  non-contributory     Past Surgical History: Past Surgical History:  Procedure Laterality Date  . NO PAST SURGERIES      Obstetrical History: OB History    Gravida Para Term Preterm AB Living   1         0   SAB TAB Ectopic Multiple Live Births                  Social History: Social History   Socioeconomic History  . Marital status: Single    Spouse name: None  . Number of children: None  . Years of education: None  . Highest education level: None  Social Needs  . Financial resource strain: None  . Food insecurity - worry: None  . Food insecurity - inability: None  . Transportation needs - medical: None  . Transportation needs - non-medical: None  Occupational History  . None  Tobacco Use  . Smoking status: Never Smoker  . Smokeless tobacco: Never Used  Substance and Sexual Activity  . Alcohol use: No  .  Drug use: No  . Sexual activity: Yes    Birth control/protection: None  Other Topics Concern  . None  Social History Narrative  . None    Family History: Family History  Problem Relation Age of Onset  . Hypertension Mother   . Asthma Maternal Grandmother   . Hypertension Maternal Grandmother   . Diabetes Maternal Grandmother     Allergies: Allergies  Allergen Reactions  . Apple Juice [Apple] Hives    Medications Prior to Admission  Medication Sig Dispense Refill Last Dose  . Cholecalciferol (VITAMIN D) 2000 units CAPS Take 1 capsule (2,000 Units total) by mouth daily before breakfast. 30 capsule 11 Taking  . Prenat w/o A-FeCbn-Meth-FA-DHA (PRENATE MINI) 29-0.6-0.4-350 MG CAPS Take 1 capsule by mouth daily before breakfast. 90 capsule 4 Taking     Review of Systems   All systems reviewed and negative except as stated in HPI  Blood pressure 117/63, pulse 84, temperature 98.6 F (37 C), temperature source Oral, resp. rate 16, height 5\' 2"  (1.575 m), weight 70.3 kg (155 lb), last menstrual period 10/30/2016. General appearance: alert,  cooperative, appears stated age and no distress Lungs: clear to auscultation bilaterally Heart: regular rate and rhythm Abdomen: soft, non-tender; bowel sounds normal Extremities: Homans sign is negative, no sign of DVT Presentation: cephalic Fetal monitoringBaseline: 140 bpm, Variability: Good {> 6 bpm), Accelerations: none, early in NST and Decelerations: Absent Uterine activityNone and irritability Dilation: Closed Exam by:: lee   Prenatal labs: ABO, Rh: O/Positive/-- (07/17 0913) Antibody: Negative (07/17 0913) Rubella: 4.05 (07/17 0913) RPR: Non Reactive (11/05 1150)  HBsAg: Negative (07/17 0913)  HIV: Non Reactive (11/05 1150)  GBS: Positive (12/17 0936)  2  hr Glucola WNL Genetic screening  Elevated MSAFP - had genetic counseling; declined amniocentesis  Anatomy US normal  Prenatal Transfer Tool  Maternal Diabetes: No Genetic Screening: Abnormal:  Results: Elevated AFP, Other: elevated MSAFP Maternal Ultrasounds/Referrals: Normal Fetal Ultrasounds or other Referrals:  Fetal echo Maternal Substance Abuse:  No Significant Maternal Medications:  None Significant Maternal Lab Results: Lab values include: Group B Strep positive  No results found for this or any previous visit (from the past 24 hour(s)).  Patient Active Problem List   Diagnosis Date Noted  . Poor fetal growth 08/08/2017  . AFI (amniotic fluid index) borderline low 07/27/2017  . Poor fetal growth, affecting management of mother, antepartum condition or complication 07/27/2017  . Abnormal MSAFP (maternal serum alpha-fetoprotein), elevated 03/22/2017  . [redacted] weeks gestation of pregnancy   . Beta hemolytic strep UTI complicating pregnancy 02/12/2017  . Supervision of normal first pregnancy, antepartum 02/06/2017    Assessment/Plan:  Shannon Reynolds is a 27 y.o. G1P0 at [redacted]w[redacted]d here for IOL for FGR with EFW <10%.  #Labor: IOL, start cytotec for cervical ripening. Place FB when able.  #Pain: Undecided on  epidural #FWB: Category 1 #ID: GBS pos- PCN #MOF: breast & bottle #MOC: OCPs vs undecided #Circ:  Yes, outpt  Alroy Bailiff, MD  08/08/2017, 8:10 AM  OB FELLOW HISTORY AND PHYSICAL ATTESTATION  I confirm that I have verified the information documented in the resident's note and that I have also personally reperformed the physical exam and all medical decision making activities. I agree with above documentation and have made edits as needed.   Caryl Ada, DO

## 2017-08-09 LAB — RPR: RPR: NONREACTIVE

## 2017-08-09 NOTE — Progress Notes (Signed)
Post Partum Day 1 Subjective: no complaints, up ad lib, voiding and tolerating PO  Pain is well controlled with ibuprofen  Now planning on only bottle feeding  No BMs  Objective: Blood pressure (!) 124/50, pulse 85, temperature 99.6 F (37.6 C), temperature source Oral, resp. rate 16, height 5\' 2"  (1.575 m), weight 70.3 kg (155 lb), last menstrual period 10/30/2016, unknown if currently breastfeeding.  Physical Exam:  General: alert, cooperative and no distress Lochia: appropriate Uterine Fundus: firm Incision: NA DVT Evaluation: No evidence of DVT seen on physical exam.  Recent Labs    08/08/17 0741  HGB 10.9*  HCT 33.2*    Assessment/Plan: Patient is a G1P1 female who is PPD#1 s/p SVD IOL for FGR  Plan for discharge tomorrow and Contraception Most likely oral contraceptives but still unsure    LOS: 1 day   Ilsa Ihaicole Trombetta PA-S2 08/09/2017, 7:22 AM   OB FELLOW MEDICAL STUDENT NOTE ATTESTATION  I confirm that I have verified the information documented in the medical student's note and that I have also personally performed the physical exam and all medical decision making activities.  Pt PPD#1 from SVD. Doing well, VS Stable. Undecided on contraception, counseled.  Plan to d/c home tomorrow  Frederik PearJulie P Arnisha Laffoon, MD OB Fellow 08/09/2017, 9:14 AM

## 2017-08-10 ENCOUNTER — Encounter: Payer: Medicaid Other | Admitting: Certified Nurse Midwife

## 2017-08-10 MED ORDER — IBUPROFEN 600 MG PO TABS: 600.0000 mg | ORAL_TABLET | Freq: Four times a day (QID) | ORAL | 0 refills | Status: DC

## 2017-08-10 NOTE — Discharge Summary (Signed)
OB Discharge Summary     Patient Name: Shannon FishermanKyshma Reynolds DOB: 05/09/1991 MRN: [id number]  Date of admission: 08/08/2017 Delivering MD: Pincus LargePHELPS, JAZMA Y   Date of discharge: 08/10/2017  Admitting diagnosis: INDUCTION Intrauterine pregnancy: 4028w0d     Secondary diagnosis:  Active Problems:   Poor fetal growth   SVD (spontaneous vaginal delivery)      Discharge diagnosis: Term Pregnancy Delivered                                                                                                Post partum procedures:n/a  Augmentation: AROM and Foley Balloon  Complications: None  Hospital course:  Induction of Labor With Vaginal Delivery     27 y.o. yo G1P1001 at 3028w0d was admitted for induction of labor for IUGR on 08/08/2017. Patient had an uncomplicated labor course as follows:  Membrane Rupture Time/Date: 7:18 PM ,08/08/2017   Intrapartum Procedures: Episiotomy: None [1]                                         Lacerations:  None [1]  Patient had a delivery of a Viable infant. 08/08/2017  Information for the patient's newborn:  Adolphus BirchwoodUmstead, Boy Keegan [[id number]][[id number]]  Delivery Method: Vaginal, Spontaneous(Filed from Delivery Summary)    Pateint had an uncomplicated postpartum course.  She is ambulating, tolerating a regular diet, passing flatus, and urinating well. Patient is discharged home in stable condition on 08/10/17.   Physical exam  Vitals:   08/09/17 0223 08/09/17 1032 08/09/17 1850 08/10/17 0608  BP: (!) 124/50 117/61 117/64 107/73  Pulse: 85 79 66 63  Resp: 16 17 16 16   Temp: 99.6 F (37.6 C) 98.2 F (36.8 C) 98 F (36.7 C) 97.6 F (36.4 C)  TempSrc: Oral Oral Oral Oral  SpO2:  99% 100% 100%  Weight:      Height:       General: alert, cooperative and no distress Lochia: appropriate Uterine Fundus: firm Incision: N/A DVT Evaluation: No evidence of DVT seen on physical exam. No significant calf/ankle edema. Labs: Lab Results  Component Value Date   WBC 14.1 (H)  08/08/2017   HGB 10.9 (L) 08/08/2017   HCT 33.2 (L) 08/08/2017   MCV 80.8 08/08/2017   PLT 159 08/08/2017   No flowsheet data found.  Discharge instruction: per After Visit Summary and "Baby and Me Booklet".  After visit meds:  Allergies as of 08/10/2017      Reactions   Apple Juice [apple] Hives      Medication List    STOP taking these medications   PRENATE MINI 29-0.6-0.4-350 MG Caps     TAKE these medications   ibuprofen 600 MG tablet Commonly known as:  ADVIL,MOTRIN Take 1 tablet (600 mg total) by mouth every 6 (six) hours.   Vitamin D 2000 units Caps Take 1 capsule (2,000 Units total) by mouth daily before breakfast.       Diet: routine diet  Activity: Advance as tolerated. Pelvic rest for  6 weeks.   Outpatient follow up:4 weeks Follow up Appt: Future Appointments  Date Time Provider Department Center  09/07/2017 10:30 AM Brock Bad, MD [facility] None   Follow up Visit:No Follow-up on file.  Postpartum contraception: Progesterone only pills  Newborn Data: Live born female  Birth Weight: 5 lb 1 oz (2295 g) APGAR: 8, 9  Newborn Delivery   Birth date/time:  08/08/2017 19:31:00 Delivery type:  Vaginal, Spontaneous     Baby Feeding: Bottle Disposition:home with mother   08/10/2017 Alroy Bailiff, MD  I spoke with and examined patient and agree with resident/PA/SNM's note and plan of care.  Cathie Beams, CNM 08/15/2017 9:09 AM

## 2017-09-07 ENCOUNTER — Ambulatory Visit: Payer: Medicaid Other | Admitting: Obstetrics

## 2017-09-11 ENCOUNTER — Encounter: Payer: Self-pay | Admitting: Certified Nurse Midwife

## 2017-09-11 ENCOUNTER — Ambulatory Visit (INDEPENDENT_AMBULATORY_CARE_PROVIDER_SITE_OTHER): Payer: Medicaid Other | Admitting: Certified Nurse Midwife

## 2017-09-11 ENCOUNTER — Other Ambulatory Visit (HOSPITAL_COMMUNITY)
Admission: RE | Admit: 2017-09-11 | Discharge: 2017-09-11 | Disposition: A | Discharge status: Home / Self Care | Payer: Medicaid Other | Source: Ambulatory Visit | Attending: Obstetrics | Admitting: Obstetrics

## 2017-09-11 DIAGNOSIS — Z1389 Encounter for screening for other disorder: Secondary | ICD-10-CM

## 2017-09-11 DIAGNOSIS — Z30011 Encounter for initial prescription of contraceptive pills: Secondary | ICD-10-CM

## 2017-09-11 DIAGNOSIS — E559 Vitamin D deficiency, unspecified: Secondary | ICD-10-CM

## 2017-09-11 MED ORDER — VITAMIN D (ERGOCALCIFEROL) 1.25 MG (50000 UNIT) PO CAPS
50000.0000 [IU] | ORAL_CAPSULE | ORAL | 2 refills | Status: AC
Start: 2017-09-11 — End: ?

## 2017-09-11 MED ORDER — NORETHIN-ETH ESTRAD-FE BIPHAS 1 MG-10 MCG / 10 MCG PO TABS: 1.0000 | ORAL_TABLET | Freq: Every day | ORAL | 4 refills | Status: AC

## 2017-09-11 NOTE — Progress Notes (Signed)
Post Partum Exam  Shannon Reynolds is a 27 y.o. [id number] female who presents for a postpartum visit. She is 4 weeks postpartum following a spontaneous vaginal delivery. I have fully reviewed the prenatal and intrapartum course. The delivery was at 39 gestational weeks.  Anesthesia: none. Postpartum course has been unremarkable. Baby's course has been unremarkable. Baby is feeding by bottle - Similac Neosure. Bleeding no bleeding. Bowel function is normal. Bladder function is normal. Patient is sexually active. Contraception method is none. Postpartum depression screening:neg EPDS: 0  The following portions of the patient's history were reviewed and updated as appropriate: allergies, current medications, past family history, past medical history, past social history, past surgical history and problem list. Last pap smear done unknown.    Review of Systems Pertinent items noted in HPI and remainder of comprehensive ROS otherwise negative.    Objective:  Blood pressure 134/84, pulse 79, height 5\' 2"  (1.575 m), weight 136 lb 12.8 oz (62.1 kg), last menstrual period 09/11/2017, unknown if currently breastfeeding.  General:  alert, cooperative and no distress   Breasts:  inspection negative, no nipple discharge or bleeding, no masses or nodularity palpable  Lungs: clear to auscultation bilaterally  Heart:  regular rate and rhythm, S1, S2 normal, no murmur, click, rub or gallop  Abdomen: soft, non-tender; bowel sounds normal; no masses,  no organomegaly   Vulva:  normal  Vagina: normal vagina, no discharge, exudate, lesion, or erythema  Cervix:  normal; friable with Pap smear  Corpus: normal size, contour, position, consistency, mobility, non-tender  Adnexa:  normal adnexa  Rectal Exam: Not performed.        Assessment:    Normal 4 week postpartum exam. Pap smear done at today's visit.   Contraception management: start OCPs in 2 weeks  Plan:   1. Contraception: condoms and OCP  (estrogen/progesterone) 2. Start OCPs in 2 weeks.  ACHES reviewed. 3. Follow up in: 1 year or as needed.

## 2017-09-13 LAB — CYTOLOGY - PAP: DIAGNOSIS: NEGATIVE

## 2017-09-14 ENCOUNTER — Other Ambulatory Visit: Payer: Self-pay | Admitting: Certified Nurse Midwife

## 2018-05-08 IMAGING — US US MFM FETAL BPP W/O NON-STRESS
1 series · 12 of 23 positions shown · non-contrast
Comparison: none

[Series 1: us mfm fetal bpp w/o non-stress · 23 acquisitions, 12 frames shown]
[im 1/23]
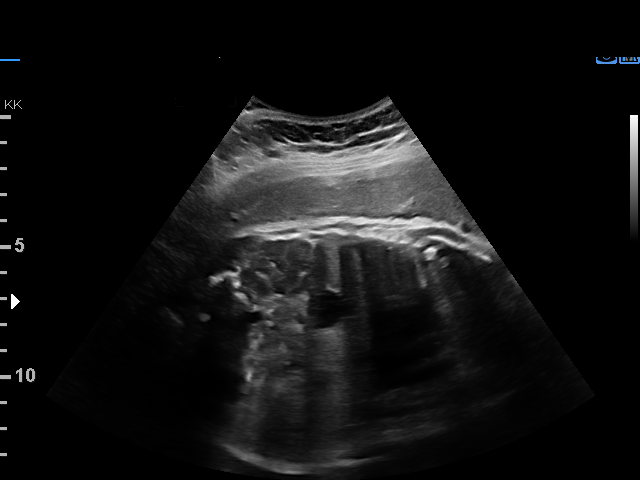
[im 3/23]
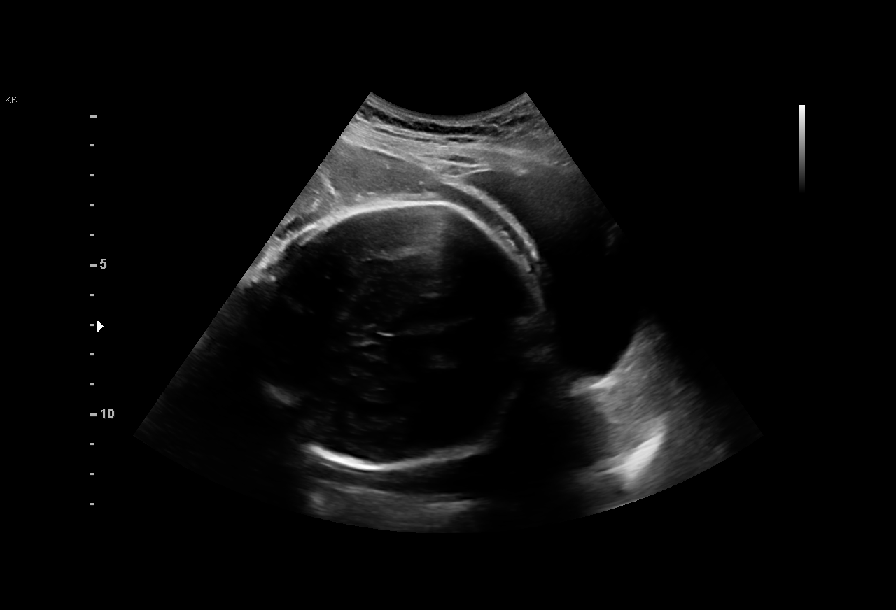
[im 5/23]
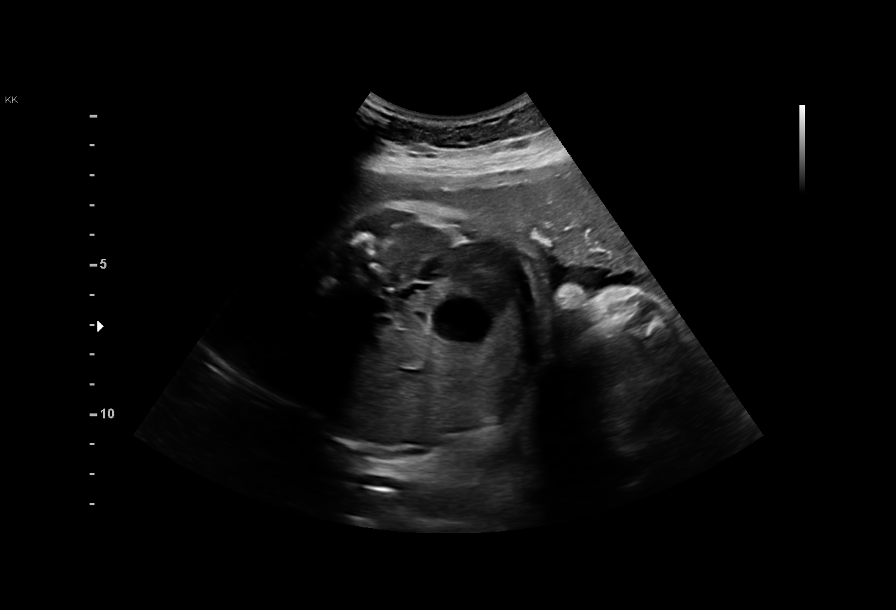
[im 7/23]
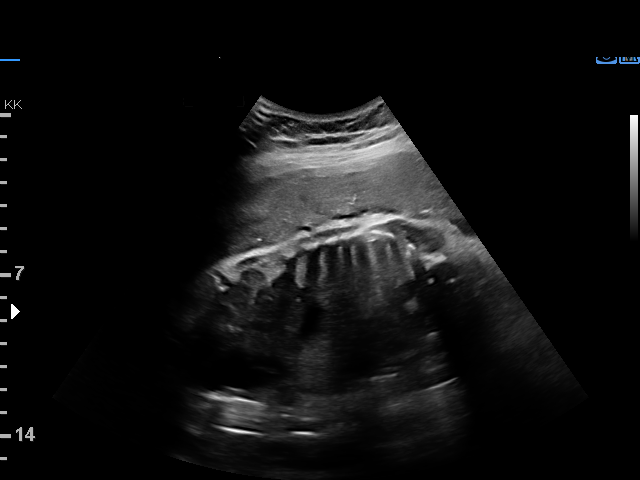
[im 9/23]
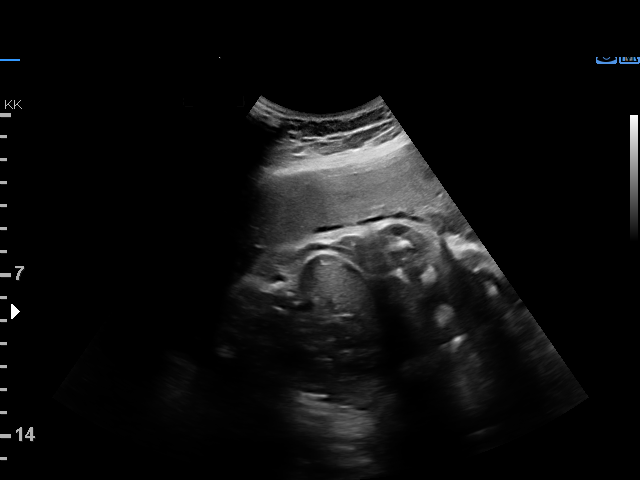
[im 11/23]
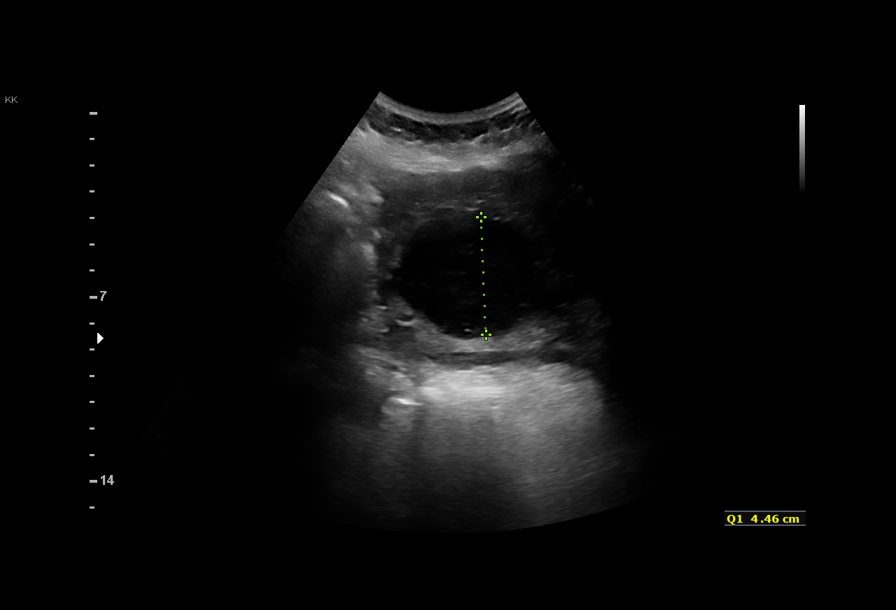
[im 13/23]
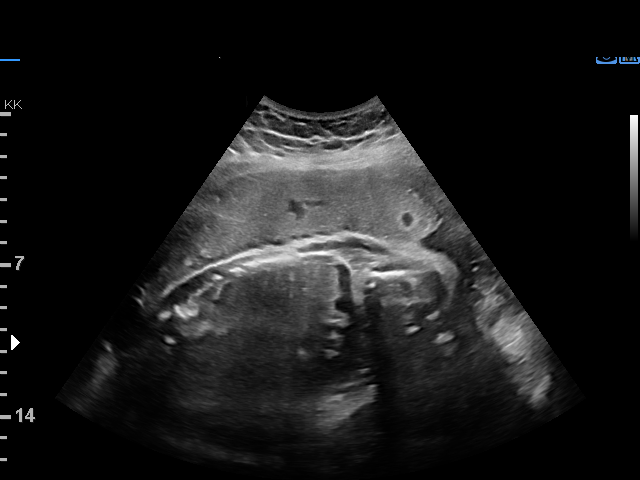
[im 15/23]
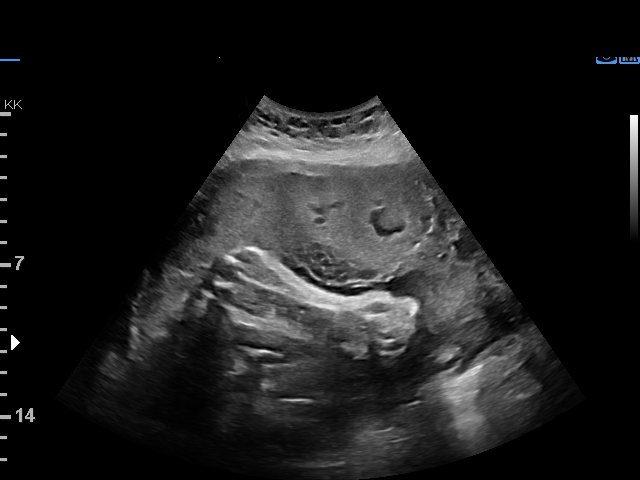
[im 17/23]
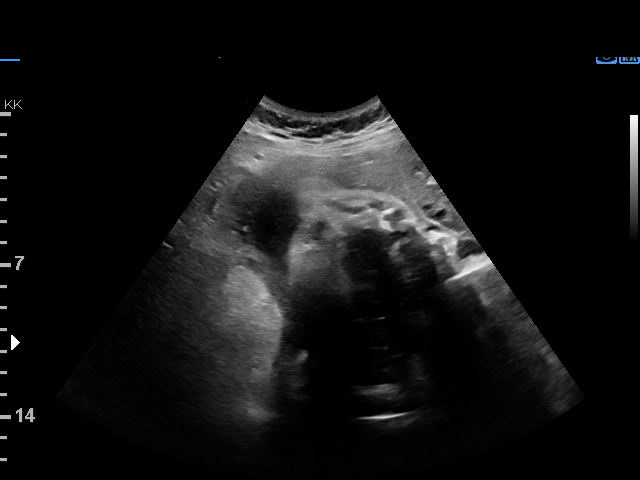
[im 19/23]
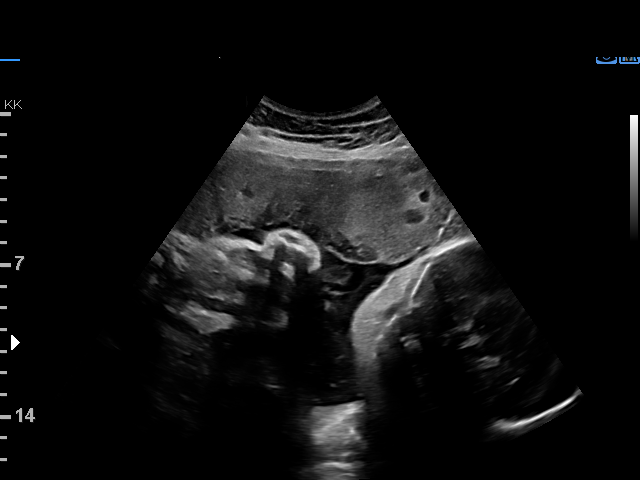
[im 21/23]
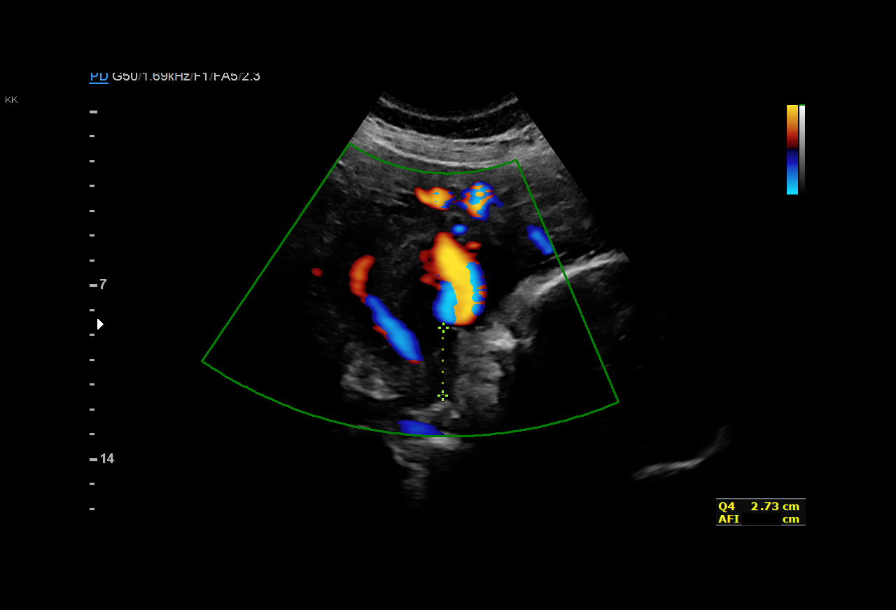
[im 23/23]
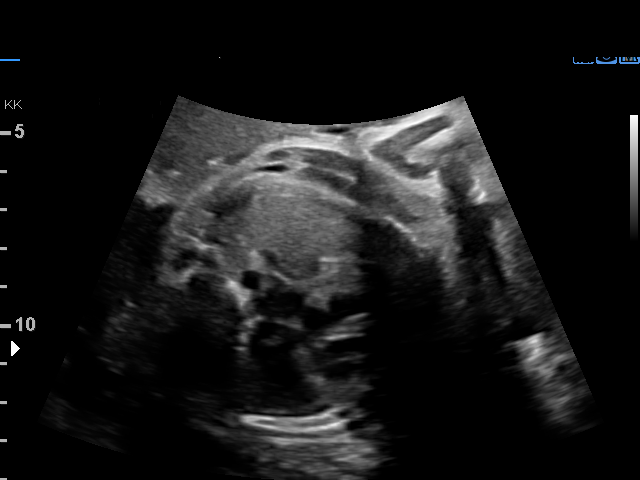

[12 of 23 positions shown; findings below may reference images not displayed]

1  BAMBUCAFE TARLA            [id number]      [id number]     [id number]
Indications

37 weeks gestation of pregnancy
Elevated MSAFP (2.8 MoM)
Small for gestational age fetus affecting
management of mother
OB History

Blood Type:            Height:  5'2"   Weight (lb):  140       BMI:
Gravidity:    1
Fetal Evaluation

Num Of Fetuses:     1
Fetal Heart         134
Rate(bpm):
Cardiac Activity:   Observed
Presentation:       Cephalic
Placenta:           Anterior, above cervical os
P. Cord Insertion:  Previously Visualized

Amniotic Fluid
AFI FV:      Subjectively within normal limits

AFI Sum(cm)     %Tile       Largest Pocket(cm)
10.65           29

RUQ(cm)       RLQ(cm)       LUQ(cm)        LLQ(cm)
4.46
Biophysical Evaluation
Amniotic F.V:   Pocket => 2 cm two         F. Tone:        Observed
planes
F. Movement:    Observed                   Score:          [DATE]
F. Breathing:   Observed
Gestational Age

LMP:           38w 4d        Date:  10/30/16                 EDD:   08/06/17
Best:          37w 2d     Det. By:  U/S  (03/22/17)          EDD:   08/15/17
Impression

Singleton intrauterine pregnancy at 37 weeks 2 days
gestation with fetal cardiac activity
Cephclie presentation
BPP [DATE] with an AFI > 10 cm
Recommendations

Weekly BPPs
